# Patient Record
Sex: Female | Born: 1945
Health system: Southern US, Community
[De-identification: ages and names within clinical notes are randomized; demographics above are authoritative.]

## PROBLEM LIST (undated history)

## (undated) DIAGNOSIS — G4762 Sleep related leg cramps: Secondary | ICD-10-CM

## (undated) DIAGNOSIS — H269 Unspecified cataract: Secondary | ICD-10-CM

## (undated) DIAGNOSIS — G2581 Restless legs syndrome: Secondary | ICD-10-CM

## (undated) DIAGNOSIS — I10 Essential (primary) hypertension: Secondary | ICD-10-CM

## (undated) DIAGNOSIS — E079 Disorder of thyroid, unspecified: Secondary | ICD-10-CM

## (undated) DIAGNOSIS — M8580 Other specified disorders of bone density and structure, unspecified site: Secondary | ICD-10-CM

## (undated) DIAGNOSIS — G252 Other specified forms of tremor: Principal | ICD-10-CM

## (undated) DIAGNOSIS — G250 Essential tremor: Secondary | ICD-10-CM

## (undated) DIAGNOSIS — M1990 Unspecified osteoarthritis, unspecified site: Secondary | ICD-10-CM

## (undated) DIAGNOSIS — E785 Hyperlipidemia, unspecified: Secondary | ICD-10-CM

## (undated) DIAGNOSIS — G259 Extrapyramidal and movement disorder, unspecified: Secondary | ICD-10-CM

## (undated) HISTORY — PX: PLANTAR FASCIECTOMY: SUR600

## (undated) HISTORY — PX: SHOULDER SURGERY: SHX246

## (undated) HISTORY — DX: Sleep related leg cramps: G47.62

## (undated) HISTORY — DX: Essential tremor: G25.0

## (undated) HISTORY — DX: Other specified forms of tremor: G25.2

## (undated) HISTORY — DX: Essential (primary) hypertension: I10

## (undated) HISTORY — PX: PARTIAL HYSTERECTOMY: SHX80

## (undated) HISTORY — DX: Unspecified cataract: H26.9

## (undated) HISTORY — PX: BACK SURGERY: SHX140

## (undated) HISTORY — PX: CHOLECYSTECTOMY: SHX55

## (undated) HISTORY — DX: Hyperlipidemia, unspecified: E78.5

## (undated) HISTORY — PX: REPLACEMENT TOTAL KNEE: SUR1224

## (undated) HISTORY — DX: Extrapyramidal and movement disorder, unspecified: G25.9

## (undated) HISTORY — PX: NASAL SINUS SURGERY: SHX719

## (undated) HISTORY — DX: Other specified disorders of bone density and structure, unspecified site: M85.80

## (undated) HISTORY — DX: Restless legs syndrome: G25.81

## (undated) HISTORY — DX: Unspecified osteoarthritis, unspecified site: M19.90

## (undated) HISTORY — PX: TUBAL LIGATION: SHX77

## (undated) HISTORY — PX: CATARACT EXTRACTION: SUR2

## (undated) HISTORY — PX: COLONOSCOPY: SHX174

## (undated) HISTORY — PX: TONSILLECTOMY: SUR1361

## (undated) HISTORY — DX: Disorder of thyroid, unspecified: E07.9

---

## 1999-10-30 ENCOUNTER — Encounter: Payer: Self-pay | Admitting: *Deleted

## 1999-10-30 ENCOUNTER — Encounter: Admission: RE | Admit: 1999-10-30 | Discharge: 1999-10-30 | Payer: Self-pay | Admitting: *Deleted

## 2000-07-14 ENCOUNTER — Other Ambulatory Visit: Admission: RE | Admit: 2000-07-14 | Discharge: 2000-07-14 | Payer: Self-pay | Admitting: Gastroenterology

## 2000-07-14 ENCOUNTER — Encounter (INDEPENDENT_AMBULATORY_CARE_PROVIDER_SITE_OTHER): Payer: Self-pay | Admitting: Specialist

## 2000-11-01 ENCOUNTER — Encounter: Payer: Self-pay | Admitting: *Deleted

## 2000-11-01 ENCOUNTER — Encounter: Admission: RE | Admit: 2000-11-01 | Discharge: 2000-11-01 | Payer: Self-pay | Admitting: *Deleted

## 2001-04-17 ENCOUNTER — Other Ambulatory Visit: Admission: RE | Admit: 2001-04-17 | Discharge: 2001-04-17 | Payer: Self-pay | Admitting: *Deleted

## 2001-05-01 ENCOUNTER — Encounter: Admission: RE | Admit: 2001-05-01 | Discharge: 2001-05-01 | Payer: Self-pay | Admitting: *Deleted

## 2001-05-01 ENCOUNTER — Encounter: Payer: Self-pay | Admitting: *Deleted

## 2001-06-30 ENCOUNTER — Encounter: Payer: Self-pay | Admitting: Surgery

## 2001-07-07 ENCOUNTER — Observation Stay (HOSPITAL_COMMUNITY): Admission: RE | Admit: 2001-07-07 | Discharge: 2001-07-08 | Payer: Self-pay | Admitting: Surgery

## 2001-07-07 ENCOUNTER — Encounter (INDEPENDENT_AMBULATORY_CARE_PROVIDER_SITE_OTHER): Payer: Self-pay | Admitting: Specialist

## 2001-07-07 ENCOUNTER — Encounter: Payer: Self-pay | Admitting: Surgery

## 2001-11-02 ENCOUNTER — Encounter: Admission: RE | Admit: 2001-11-02 | Discharge: 2001-11-02 | Payer: Self-pay | Admitting: *Deleted

## 2001-11-02 ENCOUNTER — Encounter: Payer: Self-pay | Admitting: *Deleted

## 2002-05-13 ENCOUNTER — Encounter: Payer: Self-pay | Admitting: Emergency Medicine

## 2002-05-13 ENCOUNTER — Emergency Department (HOSPITAL_COMMUNITY): Admission: EM | Admit: 2002-05-13 | Discharge: 2002-05-13 | Payer: Self-pay | Admitting: Emergency Medicine

## 2002-11-07 ENCOUNTER — Encounter: Admission: RE | Admit: 2002-11-07 | Discharge: 2002-11-07 | Payer: Self-pay

## 2003-08-12 ENCOUNTER — Encounter: Admission: RE | Admit: 2003-08-12 | Discharge: 2003-08-12 | Payer: Self-pay | Admitting: Family Medicine

## 2003-11-12 ENCOUNTER — Encounter: Admission: RE | Admit: 2003-11-12 | Discharge: 2003-11-12 | Payer: Self-pay | Admitting: Family Medicine

## 2004-05-19 ENCOUNTER — Encounter: Admission: RE | Admit: 2004-05-19 | Discharge: 2004-05-19 | Payer: Self-pay | Admitting: Family Medicine

## 2004-05-28 ENCOUNTER — Encounter: Admission: RE | Admit: 2004-05-28 | Discharge: 2004-05-28 | Payer: Self-pay | Admitting: Family Medicine

## 2004-06-13 ENCOUNTER — Encounter: Admission: RE | Admit: 2004-06-13 | Discharge: 2004-06-13 | Payer: Self-pay | Admitting: Orthopedic Surgery

## 2004-06-23 ENCOUNTER — Observation Stay (HOSPITAL_COMMUNITY): Admission: RE | Admit: 2004-06-23 | Discharge: 2004-06-24 | Payer: Self-pay | Admitting: Orthopedic Surgery

## 2004-11-12 ENCOUNTER — Encounter: Admission: RE | Admit: 2004-11-12 | Discharge: 2004-11-12 | Payer: Self-pay | Admitting: Family Medicine

## 2005-07-05 ENCOUNTER — Observation Stay (HOSPITAL_COMMUNITY): Admission: RE | Admit: 2005-07-05 | Discharge: 2005-07-06 | Payer: Self-pay | Admitting: Obstetrics and Gynecology

## 2005-07-05 ENCOUNTER — Encounter (INDEPENDENT_AMBULATORY_CARE_PROVIDER_SITE_OTHER): Payer: Self-pay | Admitting: Specialist

## 2005-11-17 ENCOUNTER — Encounter: Admission: RE | Admit: 2005-11-17 | Discharge: 2005-11-17 | Payer: Self-pay | Admitting: Family Medicine

## 2005-12-28 ENCOUNTER — Encounter: Admission: RE | Admit: 2005-12-28 | Discharge: 2005-12-28 | Payer: Self-pay | Admitting: Family Medicine

## 2006-01-06 ENCOUNTER — Encounter: Admission: RE | Admit: 2006-01-06 | Discharge: 2006-01-06 | Payer: Self-pay | Admitting: Family Medicine

## 2006-03-30 ENCOUNTER — Ambulatory Visit: Admission: RE | Admit: 2006-03-30 | Discharge: 2006-03-30 | Payer: Self-pay | Admitting: Unknown Physician Specialty

## 2006-03-30 ENCOUNTER — Encounter: Payer: Self-pay | Admitting: Cardiovascular Disease

## 2006-03-30 ENCOUNTER — Ambulatory Visit: Payer: Self-pay | Admitting: Cardiovascular Disease

## 2006-03-30 ENCOUNTER — Encounter: Payer: Self-pay | Admitting: Vascular Surgery

## 2006-11-21 ENCOUNTER — Encounter: Admission: RE | Admit: 2006-11-21 | Discharge: 2006-11-21 | Payer: Self-pay | Admitting: Family Medicine

## 2006-11-28 ENCOUNTER — Encounter: Admission: RE | Admit: 2006-11-28 | Discharge: 2006-11-28 | Payer: Self-pay | Admitting: Family Medicine

## 2007-11-22 ENCOUNTER — Encounter: Admission: RE | Admit: 2007-11-22 | Discharge: 2007-11-22 | Payer: Self-pay | Admitting: Family Medicine

## 2008-03-07 ENCOUNTER — Encounter: Admission: RE | Admit: 2008-03-07 | Discharge: 2008-03-07 | Payer: Self-pay | Admitting: Orthopedic Surgery

## 2008-03-15 ENCOUNTER — Encounter: Admission: RE | Admit: 2008-03-15 | Discharge: 2008-03-15 | Payer: Self-pay | Admitting: Orthopedic Surgery

## 2008-07-03 ENCOUNTER — Other Ambulatory Visit: Admission: RE | Admit: 2008-07-03 | Discharge: 2008-07-03 | Payer: Self-pay | Admitting: Family Medicine

## 2008-08-06 ENCOUNTER — Inpatient Hospital Stay (HOSPITAL_COMMUNITY): Admission: RE | Admit: 2008-08-06 | Discharge: 2008-08-08 | Payer: Self-pay | Admitting: Orthopedic Surgery

## 2008-11-22 ENCOUNTER — Encounter: Admission: RE | Admit: 2008-11-22 | Discharge: 2008-11-22 | Payer: Self-pay | Admitting: Family Medicine

## 2009-07-15 ENCOUNTER — Other Ambulatory Visit: Admission: RE | Admit: 2009-07-15 | Discharge: 2009-07-15 | Payer: Self-pay | Admitting: Family Medicine

## 2009-07-23 ENCOUNTER — Encounter: Admission: RE | Admit: 2009-07-23 | Discharge: 2009-07-23 | Payer: Self-pay | Admitting: Family Medicine

## 2009-07-28 ENCOUNTER — Encounter (INDEPENDENT_AMBULATORY_CARE_PROVIDER_SITE_OTHER): Payer: Self-pay | Admitting: *Deleted

## 2009-08-20 ENCOUNTER — Encounter (INDEPENDENT_AMBULATORY_CARE_PROVIDER_SITE_OTHER): Payer: Self-pay | Admitting: *Deleted

## 2009-08-21 ENCOUNTER — Ambulatory Visit: Payer: Self-pay | Admitting: Gastroenterology

## 2009-09-04 ENCOUNTER — Ambulatory Visit: Payer: Self-pay | Admitting: Gastroenterology

## 2009-09-12 ENCOUNTER — Encounter: Payer: Self-pay | Admitting: Gastroenterology

## 2009-11-24 ENCOUNTER — Encounter: Admission: RE | Admit: 2009-11-24 | Discharge: 2009-11-24 | Payer: Self-pay | Admitting: Family Medicine

## 2010-03-10 IMAGING — MG MM SCREEN MAMMOGRAM BILATERAL
4 series · 4 of 4 positions shown · non-contrast
Comparison: none

DG SCREEN MAMMOGRAM BILATERAL
Bilateral CC and MLO view(s) were taken.

DIGITAL SCREENING MAMMOGRAM WITH CAD:
There are scattered fibroglandular densities.  No masses or malignant type calcifications are 
identified.  Compared with prior studies.
Images were processed with CAD.

[R CC]
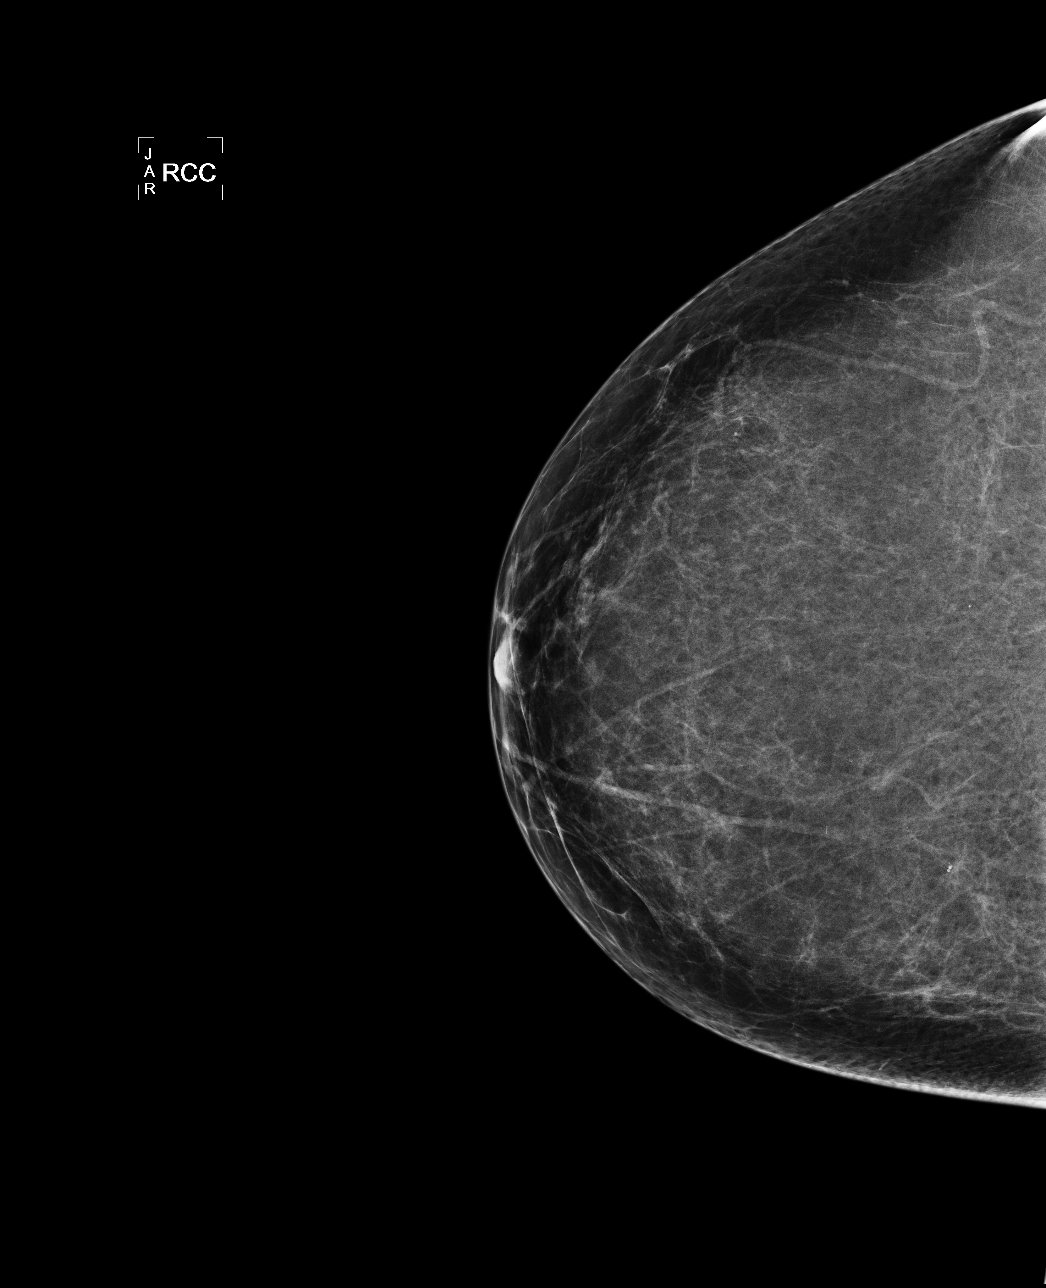

[L CC]
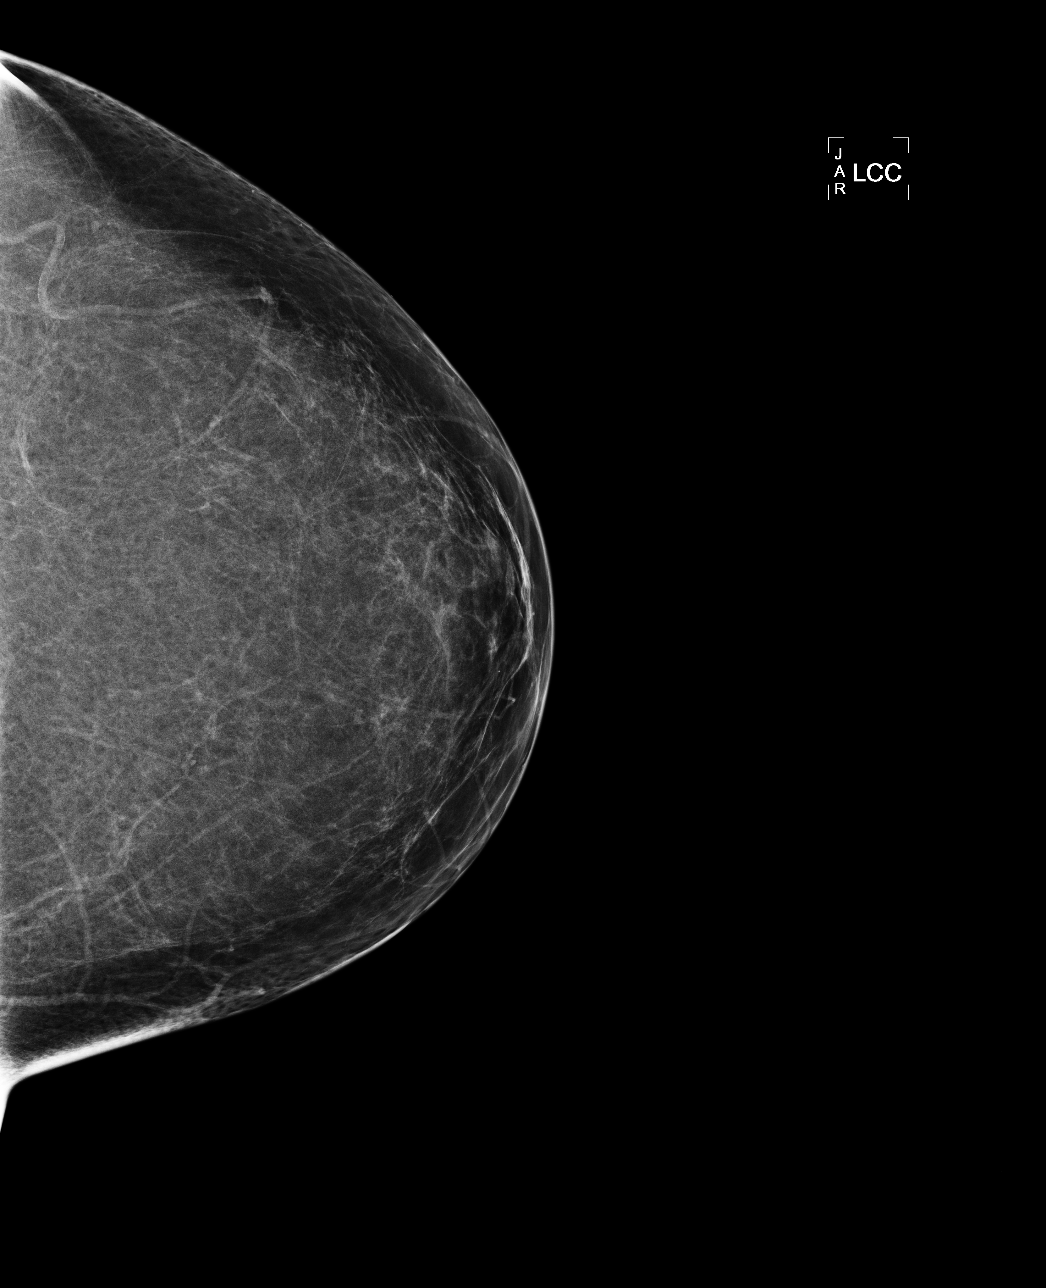

[L MLO]
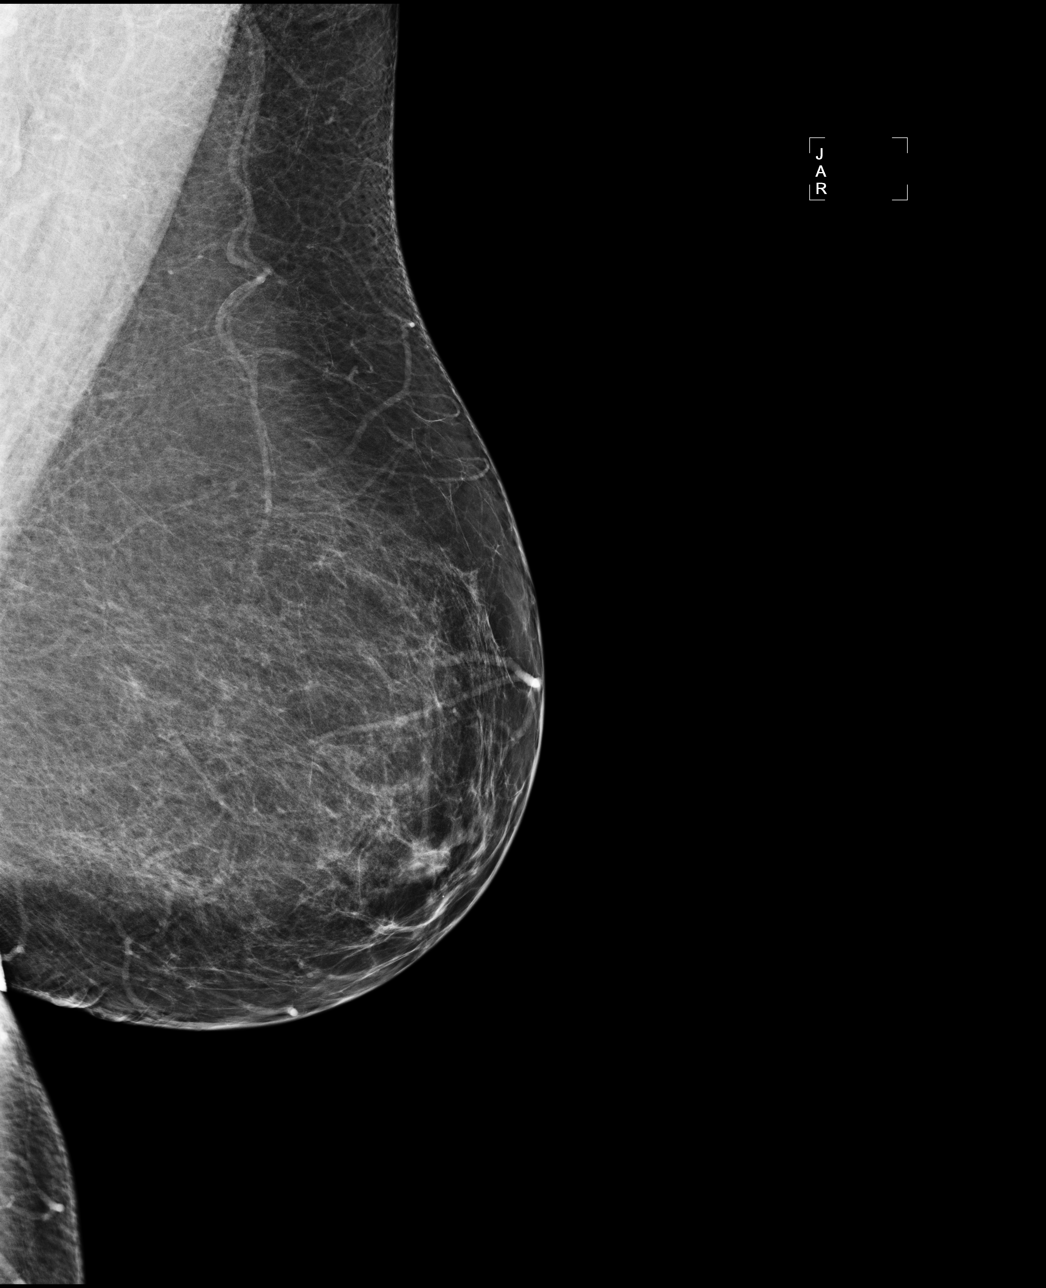

[R MLO]
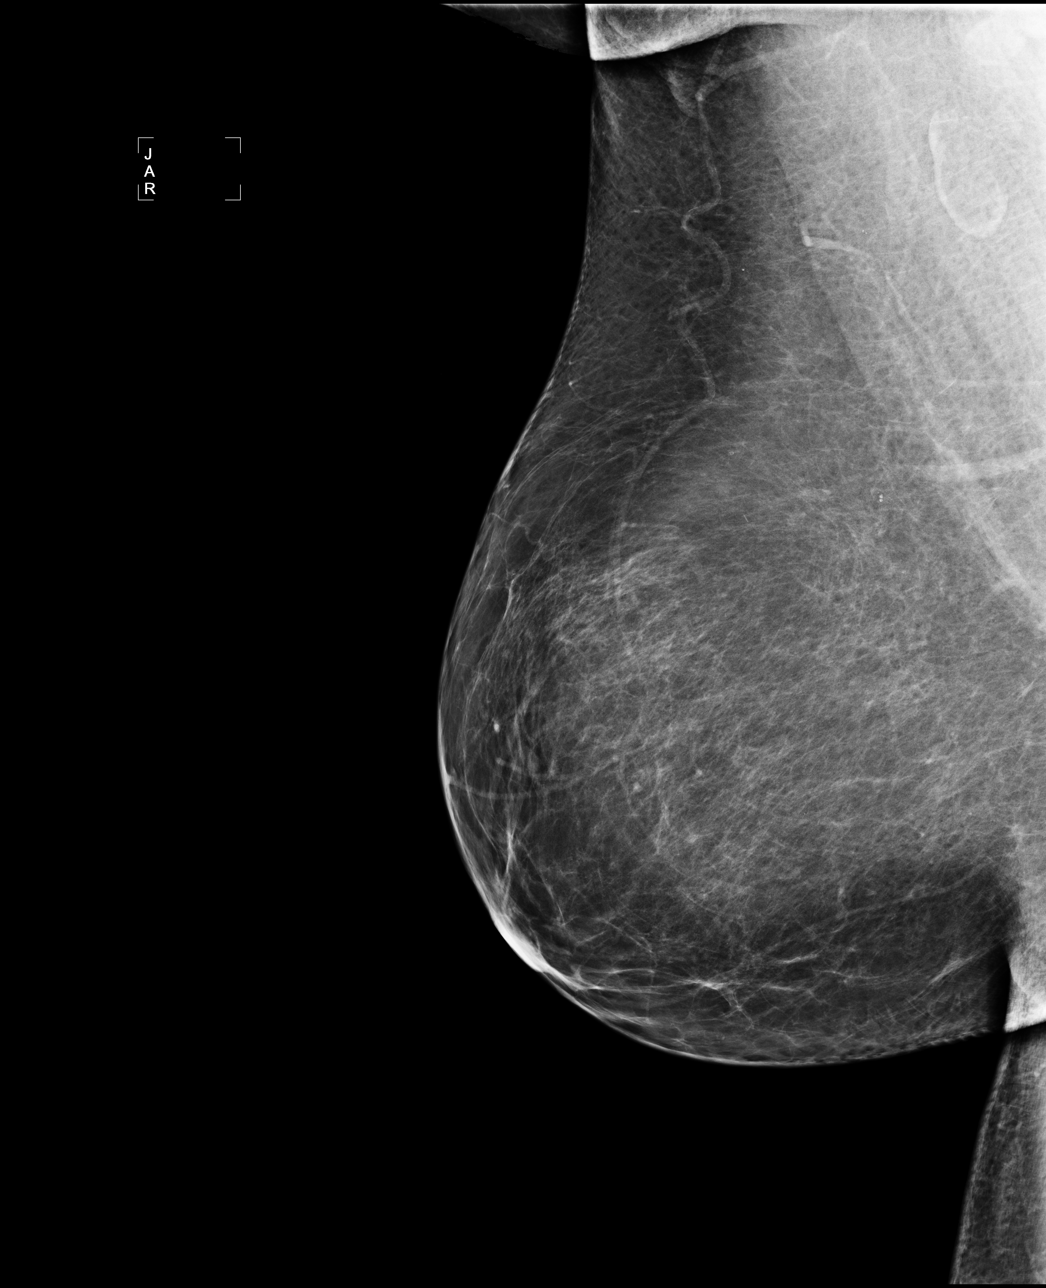

[4 of 4 positions shown; findings below may reference images not displayed]

IMPRESSION: No specific mammographic evidence of malignancy.  Next screening mammogram is recommended in one 
year.

A result letter of this screening mammogram will be mailed directly to the patient.

ASSESSMENT: Negative - BI-RADS 1

Screening mammogram in 1 year.
ANALYZED BY COMPUTER AIDED DETECTION. , THIS PROCEDURE WAS A DIGITAL MAMMOGRAM.

## 2010-06-30 NOTE — Procedures (Signed)
Summary: Colonoscopy  Patient: Brandi Richards Note: All result statuses are Final unless otherwise noted.  Tests: (1) Colonoscopy (COL)   COL Colonoscopy           DONE     [facility]     [address]     [place], [place]  [address]           COLONOSCOPY PROCEDURE REPORT           PATIENT:  Brandi, Richards  MR#:  [id number]     BIRTHDATE:  07-15-1945, 63 yrs. old  GENDER:  female           ENDOSCOPIST:  Barbette Hair. Arlyce Dice, MD     Referred by:           PROCEDURE DATE:  09/04/2009     PROCEDURE:  Colonoscopy with snare polypectomy, Colon with cold     biopsy polypectomy     ASA CLASS:  Class II     INDICATIONS:  1) history of pre-cancerous (adenomatous) colon     polyps  2) screening           MEDICATIONS:   Fentanyl 50 mcg IV, Versed 6 mg IV           DESCRIPTION OF PROCEDURE:   After the risks benefits and     alternatives of the procedure were thoroughly explained, informed     consent was obtained.  Digital rectal exam was performed and     revealed no abnormalities.   The LB CF-[id number] endoscope     was introduced through the anus and advanced to the cecum, which     was identified by both the appendix and ileocecal valve, without     limitations.  The quality of the prep was good, using MoviPrep.     The instrument was then slowly withdrawn as the colon was fully     examined.     <<PROCEDUREIMAGES>>           FINDINGS:  Two polyps were found in the cecum (see image3). 1mm     and 3mm sessile polyps - removed with cold bx snare and cold     polypectomy snare, respectively  There were multiple polyps     identified and removed (see image5, image6, image7, and image8). 4     diminutive 2-23mm polyps in proximal colon - removed with cold     polypectomy snare and submitted to pathology  A diminutive polyp     was found in the descending colon. It was 2 mm in size. Polyp was     snared without cautery. Retrieval was successful. snare polyp     Diverticula  were found in the descending colon (see image9). Few     diverticula  This was otherwise a normal examination of the colon     (see image1, image2, image4, image10, image12, image13, and     image14).   Retroflexed views in the rectum revealed no     abnormalities.    The time to cecum = 05:02 minutes. The scope was     then withdrawn (time = 16:18 min) from the patient and the     procedure completed.           COMPLICATIONS:  None           ENDOSCOPIC IMPRESSION:     1) Two polyps in the cecum     2) Polyps, multiple  3) 2 mm diminutive polyp in the descending colon     4) Diverticula in the descending colon     5) Otherwise normal examination     RECOMMENDATIONS:     1) Colonoscopy in 3 years due to multiplicity of polyps           REPEAT EXAM:  In 3 year(s) for Colonoscopy.           ______________________________     Barbette Hair. Arlyce Dice, MD           CC: Merri Brunette, MD           n.     Rosalie DoctorBarbette Hair. Kaplan at 09/04/2009 08:46 AM           Page 2 of 3   [place], [place], [id number]  Note: An exclamation mark (!) indicates a result that was not dispersed into the flowsheet. Document Creation Date: 09/04/2009 8:47 AM _______________________________________________________________________  (1) Order result status: Final Collection or observation date-time: 09/04/2009 08:36 Requested date-time:  Receipt date-time:  Reported date-time:  Referring Physician:   Ordering Physician: Melvia Heaps [phone]) Specimen Source:  Source: Launa Grill Order Number: [id number] Lab site:   Appended Document: Colonoscopy     Procedures Next Due Date:    Colonoscopy: 08/2012

## 2010-06-30 NOTE — Letter (Signed)
Summary: Previsit letter  [facility]  [address] [place], [place] [address]   Phone: [phone]  Fax: [phone]       07/28/2009 MRN: [id number]  [facility] [address] [place], [place]  [address]  Dear Ms. Rua,  Welcome to the Gastroenterology Division at [facility].    You are scheduled to see a nurse for your pre-procedure visit on 08-21-09 at 8:00a.m. on the 3rd floor at [facility], [address]  We ask that you try to arrive at our office 15 minutes prior to your appointment time to allow for check-in.  Your nurse visit will consist of discussing your medical and surgical history, your immediate family medical history, and your medications.    Please bring a complete list of all your medications or, if you prefer, bring the medication bottles and we will list them.  We will need to be aware of both prescribed and over the counter drugs.  We will need to know exact dosage information as well.  If you are on blood thinners (Coumadin, Plavix, Aggrenox, Ticlid, etc.) please call our office today/prior to your appointment, as we need to consult with your physician about holding your medication.   Please be prepared to read and sign documents such as consent forms, a financial agreement, and acknowledgement forms.  If necessary, and with your consent, a friend or relative is welcome to sit-in on the nurse visit with you.  Please bring your insurance card so that we may make a copy of it.  If your insurance requires a referral to see a specialist, please bring your referral form from your primary care physician.  No co-pay is required for this nurse visit.     If you cannot keep your appointment, please call [phone] to cancel or reschedule prior to your appointment date.  This allows [place] the opportunity to schedule an appointment for another patient in need of care.    Thank you for choosing [facility] for your medical  needs.  We appreciate the opportunity to care for you.  Please visit [place] at our website  to learn more about our practice.                     Sincerely.                                                                                                                   The Gastroenterology Division

## 2010-06-30 NOTE — Letter (Signed)
Summary: [facility] Instructions  Brandi Richards  [address] [place], [place] [address]   Phone: [phone]  Fax: [phone]       MAIE KESINGER    04/10/1946    MRN: [id number]        Procedure Day Dorna Bloom:  Lenor Coffin  09/04/09     Arrival Time:  7:30AM      Procedure Time:  8:00AM     Location of Procedure:                    Juliann Pares  [facility] (4th Floor)                        PREPARATION FOR COLONOSCOPY WITH MOVIPREP   Starting 5 days prior to your procedure 08/30/09 do not eat nuts, seeds, popcorn, corn, beans, peas,  salads, or any raw vegetables.  Do not take any fiber supplements (e.g. Metamucil, Citrucel, and Benefiber).  THE DAY BEFORE YOUR PROCEDURE         DATE: 09/03/09  DAY: WEDNESDAY  1.  Drink clear liquids the entire day-NO SOLID FOOD  2.  Do not drink anything colored red or purple.  Avoid juices with pulp.  No orange juice.  3.  Drink at least 64 oz. (8 glasses) of fluid/clear liquids during the day to prevent dehydration and help the prep work efficiently.  CLEAR LIQUIDS INCLUDE: Water Jello Ice Popsicles Tea (sugar ok, no milk/cream) Powdered fruit flavored drinks Coffee (sugar ok, no milk/cream) Gatorade Juice: apple, white grape, white cranberry  Lemonade Clear bullion, consomm, broth Carbonated beverages (any kind) Strained chicken noodle soup Hard Candy                             4.  In the morning, mix first dose of MoviPrep solution:    Empty 1 Pouch A and 1 Pouch B into the disposable container    Add lukewarm drinking water to the top line of the container. Mix to dissolve    Refrigerate (mixed solution should be used within 24 hrs)  5.  Begin drinking the prep at 5:00 p.m. The MoviPrep container is divided by 4 marks.   Every 15 minutes drink the solution down to the next mark (approximately 8 oz) until the full liter is complete.   6.  Follow completed prep with 16 oz of clear liquid of your choice  (Nothing red or purple).  Continue to drink clear liquids until bedtime.  7.  Before going to bed, mix second dose of MoviPrep solution:    Empty 1 Pouch A and 1 Pouch B into the disposable container    Add lukewarm drinking water to the top line of the container. Mix to dissolve    Refrigerate  THE DAY OF YOUR PROCEDURE      DATE: 09/04/09  DAY: THURSDAY  Beginning at 3:00AM (5 hours before procedure):         1. Every 15 minutes, drink the solution down to the next mark (approx 8 oz) until the full liter is complete.  2. Follow completed prep with 16 oz. of clear liquid of your choice.    3. You may drink clear liquids until 6:00AM (2 HOURS BEFORE PROCEDURE).   MEDICATION INSTRUCTIONS  Unless otherwise instructed, you should take regular prescription medications with a small sip of water   as early as possible the  morning of your procedure.  Hold HCTZ the day of your procedure only before coming in for procedure       OTHER INSTRUCTIONS  You will need a responsible adult at least 66 years of age to accompany you and drive you home.   This person must remain in the waiting room during your procedure.  Wear loose fitting clothing that is easily removed.  Leave jewelry and other valuables at home.  However, you may wish to bring a book to read or  an iPod/MP3 player to listen to music as you wait for your procedure to start.  Remove all body piercing jewelry and leave at home.  Total time from sign-in until discharge is approximately 2-3 hours.  You should go home directly after your procedure and rest.  You can resume normal activities the  day after your procedure.  The day of your procedure you should not:   Drive   Make legal decisions   Operate machinery   Drink alcohol   Return to work  You will receive specific instructions about eating, activities and medications before you leave.    The above instructions have been reviewed and explained to me by    Karl Bales RN  August 21, 2009 8:16 AM     I fully understand and can verbalize these instructions _____________________________ Date _________

## 2010-06-30 NOTE — Miscellaneous (Signed)
Summary: [facility] previsit  Clinical Lists Changes  Medications: Added new medication of MOVIPREP 100 GM  SOLR (PEG-KCL-NACL-NASULF-NA ASC-C) As per prep instructions. - Signed Rx of MOVIPREP 100 GM  SOLR (PEG-KCL-NACL-NASULF-NA ASC-C) As per prep instructions.;  #1 x 0;  Signed;  Entered by: Karl Bales RN;  Authorized by: Louis Meckel MD;  Method used: Electronically to [facility] [address] [place], [place], [place]  [address], Ph: [phone], Fax: [phone] Allergies: Added new allergy or adverse reaction of SULFA Observations: Added new observation of ALLERGY REV: Done (08/21/2009 7:52) Added new observation of NKA: F (08/21/2009 7:52)    Prescriptions: MOVIPREP 100 GM  SOLR (PEG-KCL-NACL-NASULF-NA ASC-C) As per prep instructions.  #1 x 0   Entered by:   Karl Bales RN   Authorized by:   Louis Meckel MD   Signed by:   Karl Bales RN on 08/21/2009   Method used:   Electronically to        [facility] (retail)       [address]       [place], [place]  [address]       Ph: [phone]       Fax: [phone]   RxID:   [id number]

## 2010-06-30 NOTE — Letter (Signed)
Summary: Patient Notice- Polyp Results  [facility]  [address] [place], [place] [address]   Phone: [phone]  Fax: [phone]        September 12, 2009 MRN: [id number]    [facility] [address] [place], [place]  [address]    Dear Ms. Cotterman,  I am pleased to inform you that the colon polyp(s) removed during your recent colonoscopy was (were) found to be benign (no cancer detected) upon pathologic examination.  I recommend you have a repeat colonoscopy examination in 3_ years to look for recurrent polyps, as having colon polyps increases your risk for having recurrent polyps or even colon cancer in the future.  Should you develop new or worsening symptoms of abdominal pain, bowel habit changes or bleeding from the rectum or bowels, please schedule an evaluation with either your primary care physician or with me.  Additional information/recommendations:  __ No further action with gastroenterology is needed at this time. Please      follow-up with your primary care physician for your other healthcare      needs.  __ Please call [phone] to schedule a return visit to review your      situation.  __ Please keep your follow-up visit as already scheduled.  _x_ Continue treatment plan as outlined the day of your exam.  Please call us if you are having persistent problems or have questions about your condition that have not been fully answered at this time.  Sincerely,  Louis Meckel MD  This letter has been electronically signed by your physician.  Appended Document: Patient Notice- Polyp Results letter mailed 4.20.11.

## 2010-09-10 LAB — URINALYSIS, ROUTINE W REFLEX MICROSCOPIC
Bilirubin Urine: NEGATIVE
Hgb urine dipstick: NEGATIVE
Nitrite: NEGATIVE
Protein, ur: NEGATIVE mg/dL
Urobilinogen, UA: 0.2 mg/dL (ref 0.0–1.0)

## 2010-09-10 LAB — COMPREHENSIVE METABOLIC PANEL
ALT: 53 U/L — ABNORMAL HIGH (ref 0–35)
AST: 41 U/L — ABNORMAL HIGH (ref 0–37)
Albumin: 4.4 g/dL (ref 3.5–5.2)
Calcium: 10.1 mg/dL (ref 8.4–10.5)
Creatinine, Ser: 0.89 mg/dL (ref 0.4–1.2)
GFR calc Af Amer: >60 mL/min (ref >60)
GFR calc non Af Amer: >60 mL/min (ref >60)
Sodium: 144 mEq/L (ref 135–145)
Total Protein: 7 g/dL (ref 6.0–8.3)

## 2010-09-10 LAB — CBC
MCHC: 34.4 g/dL (ref 30.0–36.0)
MCV: 98.6 fL (ref 78.0–100.0)
Platelets: 185 10*3/uL (ref 150–400)
RDW: 12.1 % (ref 11.5–15.5)

## 2010-09-10 LAB — URINE CULTURE: Colony Count: 25000

## 2010-09-10 LAB — PROTIME-INR: Prothrombin Time: 13.3 seconds (ref 11.6–15.2)

## 2010-09-10 LAB — DIFFERENTIAL
Eosinophils Relative: 3 % (ref 0–5)
Lymphocytes Relative: 42 % (ref 12–46)
Lymphs Abs: 1.9 10*3/uL (ref 0.7–4.0)
Monocytes Absolute: 0.4 10*3/uL (ref 0.1–1.0)
Monocytes Relative: 9 % (ref 3–12)

## 2010-09-10 LAB — HEMOGLOBIN AND HEMATOCRIT, BLOOD
HCT: 41.3 % (ref 36.0–46.0)
Hemoglobin: 12.7 g/dL (ref 12.0–15.0)
Hemoglobin: 13.2 g/dL (ref 12.0–15.0)
Hemoglobin: 14.1 g/dL (ref 12.0–15.0)

## 2010-10-13 NOTE — H&P (Signed)
[name], Richards               ACCOUNT NO.:  [id number]   MEDICAL RECORD NO.:  [id number]          PATIENT TYPE:  INP   LOCATION:                               FACILITY:  [facility]   PHYSICIAN:  Georges Lynch. Gioffre, M.D.DATE OF BIRTH:  Aug 01, 1945   DATE OF ADMISSION:  08/06/2008  DATE OF DISCHARGE:                              HISTORY & PHYSICAL   CHIEF COMPLAINT:  Bilateral lower legs and back pain.   HISTORY OF PRESENT ILLNESS:  Brandi Richards is a 65 year old female patient  of Dr. Jeannetta Ellis who has had an evaluation for lower back and bilateral  leg pain.  Her evaluation found on a CT myelogram she had moderate to  mild stenosis at L4-5, but she also has a small disk at L5-S1, putting  pressure on the L5 nerve root.  The patient has failed conservative  treatment.  This  has been going on for an excess of 3 years.  She does  have some weakness at the extensor mechanism of the toes on the left.  The patient has elected to proceed with a complete decompressive lumbar  laminectomy at L4-5 with an evaluation of L5-S1 for possible small disk  L5-S1 left.   ALLERGIES:  SULFA, INTOLERANT TO PERCOCET CAUSING SEVERE NAUSEA AND  VOMITING AND SLIGHT NAUSEA WITH DILAUDID.  HAS USED MEPERGAN FORTIS IN  THE PAST WITH GOOD RESULTS WITHOUT ANY PROBLEMS.   CURRENT MEDICATIONS:  1. Lisinopril 10 mg a day.  2. Hydrochlorothiazide 12.5 mg a day.  3. Crestor 10 mg a day.  4. Synthroid 112 mcg a day.  5. Zantac 150 mg once a day.  6. Dilaudid 2 mg p.o. q.h.s. p.r.n. pain.  7. Multivitamins once a day.  8. Vitamin B1 once at noon.  9. Vitamin D once today.  10.Fish oil 2000 mg twice a day.  11.Calcium 600 twice a day.  12.Folic acid 800 mcg once a day.  13.Vitamin B12 1000 mcg once a day.   PAST MEDICAL HISTORY:  1. Hypertension.  2. Hiatal hernia.  3. Asymptomatic diverticulosis.  4. Thyroid disease.  5. Recent workup with some reported leukopenia with treatment with      folic acid and B12.   Recent rechecked with no results yet.  History      of a stress test 5-10 years previous was normal.   REVIEW OF SYSTEMS:  NEUROLOGIC:  Unremarkable.  PULMONARY:  Unremarkable.  CARDIOVASCULAR:  She used to have some tachycardia, but  that resolved once being placed on thyroid medicine.  Stress test was 5-  10 years previous for routine checkup, was reported normal.  Hypertension medications have been stable.  She has some heartburn and  indigestion related to her hiatal hernia and is well controlled with her  Zantac.  She has had her gallbladder removed from stones in the past.  She has asymptomatic diverticulosis.  GU:  Unremarkable.  ENDOCRINE:  Unremarkable except for well stable controlled thyroid.  HEMATOLOGIC.  She reports her white blood count was slightly low.  She was placed on  folic acid and vitamin B12 about  3-4 weeks previous.  She has recently  had it rechecked and does not know the results.  She denies any fevers,  chills or any other illnesses at this present time.   PAST SURGICAL HISTORY:  1. Tonsillectomy.  2. Left shoulder.  3. Tubes tied.  4. Hysterectomy.  5. Prolapsed bladder.  6. Sinus surgery.  7. Cholecystectomy without any anesthetic complications.   With her left shoulder.  She did have some problems with severe nausea  and vomiting with her Percocet and was placed on Mepergan fortis without  any issues.   PAST FAMILY/MEDICAL HISTORY:  Father and mother are deceased, unknown  medical issue was not raised by her parents.  She has one sister who had  similar medical issues.   SOCIAL HISTORY:  The patient is married, she is retired, lives with her  husband.  She has three grown children.  She denies any smoking.  Her  husband will provide care postop.   PHYSICAL EXAMINATION:  VITAL SIGNS:  Height is 5 feet 5 inches, weight  is 185 pounds, blood pressure is 138/88, pulse of 78 and regular,  respirations 12, patient is afebrile.  The patient is a little  anxious.  GENERAL:  This is healthy-appearing, well-developed female who appears  to be slightly anxious, appears to be in no extreme distress.  HEENT:  Head was normocephalic.  Pupils equal, round and reactive.  Gross hearing is intact.  NECK:  Supple.  No palpable lymphadenopathy.  Thyroid region was  nontender.  Good range of motion.  CHEST:  Lung sounds were clear and equal bilaterally.  No wheezes,  rales, rhonchi.  HEART:  Regular rate and rhythm.  No murmurs, rubs or gallops.  ABDOMEN:  Soft, nontender.  Bowel sounds present.  UPPER EXTREMITIES:  Had good range of motion of shoulders, elbows and  wrists.  LOWER EXTREMITIES:  Showed good motion of both hips, knees and ankles  today.  PERIPHERAL VASCULAR:  Carotid pulses were 2+ no bruits.  Radial pulses  were 2+.  Posterior tibial pulses were 1+.  She had no lower extremity  edema or pigmentation changes.  NEUROLOGICAL:  The patient was conscious, alert and appropriate.  She  did have lower back and bilateral leg pain.  She does report slight  decrease of light touch sensation over lateral calves.  She has gotten a  little bit of left weak great toe extension, but she has good extension  at both ankles, good knee extension.  BREAST/RECTAL/GU:  Exams were deferred at this time.   IMPRESSION:  1. Severe lumbar and bilateral leg pain with spinal stenosis at L4-5      with possible small herniated disk at L5-S1 on the left.  2. Hypertension.  3. Thyroid disease.  4. Asymptomatic diverticulosis.  5. Possible leukopenia currently being evaluated.  6. Hiatal hernia.  7. Normal stress test 5-10 years previous.   PLAN:  The patient will undergo all routine labs and tests prior to  having a central decompressive lumbar laminectomy at L4-5 with an  evaluation L5-S1 for possible microdiskectomy.  The patient will undergo  all routine labs and test.      Jamelle Rushing, P.A.    ______________________________  Georges Lynch.  Darrelyn Hillock, M.D.    RWK/MEDQ  D:  07/30/2008  T:  07/30/2008  Job:  [id number]   cc:   Windy Fast A. Darrelyn Hillock, M.D.  Fax: [phone]

## 2010-10-13 NOTE — Op Note (Signed)
NAMEHANNIA, Brandi Richards               ACCOUNT NO.:  [id number]   MEDICAL RECORD NO.:  [id number]          PATIENT TYPE:  INP   LOCATION:  0003                         [facility]   PHYSICIAN:  Georges Lynch. Gioffre, M.D.DATE OF BIRTH:  11/25/1945   DATE OF PROCEDURE:  08/06/2008  DATE OF DISCHARGE:                               OPERATIVE REPORT   SURGEON:  Georges Lynch. Darrelyn Hillock, M.D.   ASSISTANT:  Marlowe Kays, M.D.   PREOPERATIVE DIAGNOSES:  1. Moderate spinal stenosis, 3-4 inferior to the 3-4 space.  2. Severe spinal stenosis at L4-5 with foraminal stenosis bilaterally.   POSTOPERATIVE DIAGNOSES:  1. Moderate spinal stenosis, 3-4 inferior to the 3-4 space.  2. Severe spinal stenosis at L4-5 with foraminal stenosis bilaterally.   OPERATION:  1. Complete decompressive lumbar laminectomy at L4-L5.  2. Hemilaminectomy bilaterally at L3-4 region proximally for stenosis.  3. Foraminotomies bilaterally at both levels.   PROCEDURE:  Under general anesthesia, routine orthopedic prep and drape  of the lower back was carried out.  The patient had 1 gram of IV Ancef.  She was on a spinal frame.  Two needles were placed in the back for  localization purposes.  X-ray was taken.  Once I identified the L4-5  space an incision was made over L3-4, L4-5 bilaterally.  I then  separated the muscle from the lamina and spinous process bilaterally.  The self-retaining retractors were inserted.  An x-ray was taken to  verify the position.  Following that I did a complete decompressive  lumbar laminectomy.  I started centrally at 4-5, removed the spinous  process of 4 and the lamina of 4, went down and decompressed the dura  distally, went out and did foraminotomies bilaterally.  We then advanced  proximally and prior to doing that I did insert an instrument and took  another x-ray to make sure we had the right space.  Following that we  then decompressed proximally a portion of L3-4 region until she  was wide  open.  Note, there was no stenosis proximally and now distally or  laterally after the decompression.  We did check with the hockey-stick  to make sure we had the decompression complete.  We did foraminotomies  bilaterally as well.  Note, all her pain  was mainly in the left leg and  very minimal on the right preop.  I thoroughly irrigated out the area,  loosely applied some thrombin-soaked Gelfoam and closed with the wound  in layers in the usual fashion, except I left a small distal deep part  of the wound open for drainage purposes.  Sterile Neosporin dressing was  applied.  The patient left the operating room in satisfactory condition.           ______________________________  Georges Lynch Darrelyn Hillock, M.D.    RAG/MEDQ  D:  08/06/2008  T:  08/06/2008  Job:  [id number]

## 2010-10-16 NOTE — H&P (Signed)
[name], Brandi Richards               ACCOUNT NO.:  [id number]   MEDICAL RECORD NO.:  [id number]          PATIENT TYPE:   LOCATION:                                 FACILITY:   PHYSICIAN:  Randye Lobo, M.D.   DATE OF BIRTH:  [birth year]   DATE OF ADMISSION:  07/05/2005  DATE OF DISCHARGE:                                HISTORY & PHYSICAL   CHIEF COMPLAINT:  Pelvic organ prolapse.   HISTORY OF PRESENT ILLNESS:  The patient is a 65 year old gravida 3, para 92,  Caucasian female referred by Dr. Kyra Manges for evaluation and treatment  of vaginal prolapse. The patient is status post total vaginal hysterectomy  with anterior colporrhaphy secondary to vaginal prolapse in 1981 by Dr.  Elana Alm.  The patient The patient more recently had problems with recurrent  urinary tract infection, and she has been evaluated by Dr. Vic Blackbird  who performed a cystoscopy which was normal.  At that time, the patient was  noted to have an enterocele, and she was sent to Dr. Marcine Matar who  treated the patient with a pessary and vaginal estrogen cream.  The patient  was subsequently referred back to Dr. Elana Alm for further care.   The patient denies any significant problems with urinary leakage.  She does  not leak with coughing or sneezing.  She has rare leakage if she laughs  extremely hard.  The patient has some urinary incontinence which occurs if  she lets her bladder get too full.  She does have constipation which she  treats daily with a laxative at bedtime.  She denies a history of any fecal  incontinence.   The patient declines any further conservative therapy for her prolapse, and  she wishes to proceed with surgical treatment.   PAST OBSTETRIC AND GYNECOLOGIC HISTORY:  1.  Status post vaginal delivery x2.  One of the patient's children is      deceased.  She has one adopted child.  2.  Status post bilateral tubal ligation.  3.  Status post total vaginal hysterectomy with  anterior colporrhaphy in      1981.  4.  The patient is not taking any oral hormone therapy.  5.  Last Pap smear in December 2006 was within normal limits.  6.  Last mammogram was performed in June 2006 and was within normal limits.   PAST MEDICAL HISTORY:  1.  Diverticulosis.  2.  History of recurrent urinary tract infection.  3.  Hypertension.  4.  Hyperlipidemia.  5.  Hypothyroidism.   PAST SURGICAL HISTORY:  1.  Status post tonsillectomy in 1955.  2.  Status post bilateral tubal ligation.  3.  Status post total abdominal hysterectomy with anterior colporrhaphy in      1981.  4.  Status post shoulder surgery.  5.  Status post sinus surgery in 1985.  6.  Status post foot surgery in 1999.  7.  Status post cholecystectomy in 2003.   MEDICATIONS:  Synthroid, Toprol, Vitorin, Zetia, clonidine, lisinopril.   ALLERGIES:  SULFA.   SOCIAL HISTORY:  The patient has been  married for 40 years.  She is a  Fut[employer] trader.  She denies the use of tobacco, alcohol, or illicit drugs.   FAMILY HISTORY:  The patient's sister has hypertension and hyperlipidemia.  Negative family history for colon, ovarian, uterine, or breast cancer.   REVIEW OF SYSTEMS:  Please refer to History of Present Illness.   PHYSICAL EXAMINATION:  VITAL SIGNS: Height 5 feet 6 inches, weight 206  pounds.  Blood pressure 122/70.  HEENT:  Normocephalic and atraumatic.  LUNGS:  Clear to auscultation bilaterally. No evidence of murmurs, rubs, or  gallops.  ABDOMEN: Soft and nontender without evidence of hepatosplenomegaly or  organomegaly.  PELVIC: Normal external genitalia and urethra.  There is good anterior  vaginal wall support.  There appears to be good apical vaginal support.  There is a third degree rectocele with a probable enterocele.  The cervix  and uterus are absent.  On bimanual examination, there are no midline nor  adnexal masses noted.   IMPRESSION:  The patient is a 65 year old para 68 female with evidence  of a  rectocele and probable enterocele.   PLAN:  The patient will undergo a posterior colporrhaphy with probable  enterocele repair and possible iliococcygeal vaginal vault suspension at  [facility] on July 05, 2005.  Risks, benefits, and alternatives  have been discussed with patient who wishes to proceed.      Randye Lobo, M.D.  Electronically Signed     BES/MEDQ  D:  07/04/2005  T:  07/04/2005  Job:  [id number]

## 2010-10-16 NOTE — Op Note (Signed)
[facility]  Patient:    Brandi Richards, Brandi Richards Visit Number: [id number] MRN: [id number]          Service Type: SUR Location: 3W 0377 01 Attending Physician:  Katha Cabal Dictated by:   Thornton Park Daphine Deutscher, M.D. Proc. Date: 07/07/01 Admit Date:  07/07/2001   CC:         Heather Roberts, M.D.   Operative Report  [id number]  PREOPERATIVE DIAGNOSIS:  Cholelithiasis.  POSTOPERATIVE DIAGNOSIS:  Chronic cholecystitis.  PROCEDURE:  Laparoscopic cholecystectomy with intraoperative cholangiogram.  SURGEON:  Thornton Park. Daphine Deutscher, M.D.  ASSISTANT:  Sharlet Salina T. Hoxworth, M.D.  ANESTHESIA:  General endotracheal.  INDICATION:  Ms. Mastrogiovanni is a 65 year old lady, who has had at least one severe protracted attack of abdominal pain in the right upper quadrant going into her back.  There well may have been others over the years.  An ultrasound showed gallstones.  Informed consent was obtained regarding laparoscopic as well as open cholecystectomy.  DESCRIPTION OF PROCEDURE:  She was taken to room 1 and given general anesthesia.   The abdomen was prepped with Betadine and draped sterilely.  I made a longitudinal incision down into the umbilicus and through a small incision in the fascia, then through a pursestring suture of 0 Vicryl, I inserted the Hasson cannula.  The abdomen was insufflated.  The abdomen was surveyed, and it was otherwise unremarkable except after I placed my upper abdominal trocars and attempted to visualize the gallbladder, I found that it had adhesions all around the fundus.  These are depicted in the chart, as it was pretty well socked-in.  I took those down with sharp dissection, then I grasped the gallbladder and dissected free the cystic duct.  I put a clip upon the gallbladder and incised the cystic duct and then took a dynamic cholangiogram which showed intrahepatic filling and free flow of contrast into the duodenum without  evidence of stones or strictures.  The cystic was triple clipped, divided, and the gallbladder was then removed using the hook electrocautery.  I did make a little puncture and got a little bile drainage about midway up, but this was controlled.  There was no stone spillage.  The gallbladder was then removed, placed in a bag, and brought out through the umbilicus without difficulty.  I went back and reinspected the gallbladder bed, and no bleeding or bile leaks were noted.  We did cauterize a little area at the edge, but that was minimal oozing.  Everything looked good, and we irrigated and then repaired the umbilical port with Vicryl under direct laparoscopic exposure.  All of the port sites were injected with Marcaine, and then they were closed with 4-0 Vicryl, Benzoin, and Steri-Strips.  The patient seemed to tolerate the procedure well.  She will be admitted for observation overnight. Dictated by:   Thornton Park Daphine Deutscher, M.D. Attending Physician:  Katha Cabal DD:  07/07/01 TD:  07/07/01 Job: [id number]

## 2010-10-16 NOTE — Op Note (Signed)
NAMEMAKINZEY, Brandi Richards               ACCOUNT NO.:  [id number]   MEDICAL RECORD NO.:  [id number]          PATIENT TYPE:  INP   LOCATION:  0005                         FACILITY:  [facility]   PHYSICIAN:  Randye Lobo, M.D.   DATE OF BIRTH:  May 20, 1946   DATE OF PROCEDURE:  07/05/2005  DATE OF DISCHARGE:                                 OPERATIVE REPORT   PREOPERATIVE DIAGNOSES:  1.  Third degree rectocele.  2.  Probable enterocele.   POSTOPERATIVE DIAGNOSIS:  1.  Third degree rectocele.  2.  Enterocele.   PROCEDURE:  Posterior colporrhaphy with enterocele repair.   SURGEON:  Conley Simmonds, MD   ANESTHESIA:  General endotracheal, local with 0.5% lidocaine with 1:200,000  of epinephrine.   IV FLUIDS:  2200 mL of Ringer's lactate.   ESTIMATED BLOOD LOSS:  100 mL.   URINE OUTPUT:  800 mL.   COMPLICATIONS:  None.   INDICATIONS FOR PROCEDURE:  The patient is a 65 year old gravida 3, para 62  Caucasian female, status post total vaginal hysterectomy with anterior  colporrhaphy, who was noted to have a large rectocele and enterocele on  examination.  The patient had been previously treated with pessaries and  vaginal estrogen cream which was not satisfactory to her.  The patient  denies any significant symptoms of stress incontinence.  The patient wishes  for surgical treatment of her prolapse, and a plan is made to proceed with a  posterior colporrhaphy and probable enterocele repair with possible vaginal  vault suspension after risks, benefits, and alternatives are discussed with  her.   FINDINGS:  Examination under anesthesia revealed excellent anterior vaginal  wall and vaginal apical support.  The cervix and uterus are surgically  absent.  There is a large third-degree high rectocele.  Upon opening the  posterior vaginal wall, the rectocele is appreciated with a small  enterocele.   Rectal examination during and at the termination of the repair document the  absence of sutures in  the rectum.   SPECIMENS:  The enterocele sac was sent to pathology.   PROCEDURE:  The patient was reidentified in the preoperative hold area.  She  did receive Mefoxin 1 g IV for antibiotic prophylaxis.  She received both  TED hose and PAS stockings for DVT prophylaxis.   In the operating room, general endotracheal anesthesia was induced, and the  patient was then placed in the dorsal lithotomy position.  The vagina and  perineum were then sterilely prepped and draped.   Allis clamps were used to mark the midline of the posterior vaginal wall.  The posterior vaginal mucosa and the perineal body was then injected with  0.5% lidocaine with 1:200,000 of epinephrine.  A triangular wedge of  epithelium was excised from the perineal body and then the posterior vaginal  was incised vertically in the midline using a Metzenbaum scissors.  The  fascia was dissected off of the overlying vaginal epithelium bilaterally.  At the top of the rectocele, there was an obvious enterocele sac which was  appreciated.  This was first dissected off of the overlying vaginal  epithelium.  The enterocele sac was entered sharply, and the edges of the  enterocele sac were held open with hemostat clamps.  A gloved hand was then  placed in the rectum, and the enterocele sac was dissected off of the  overlying vaginal mucosa superiorly and off of the rectum inferiorly.  A  single pursestring suture of 2-0 Ethibond was used to close the enterocele  sac, and the excess sac was excised and sent to pathology.  The posterior  colporrhaphy was then performed with a series of vertical mattress sutures  of both 2-0 Vicryl and 0 Vicryl which produced excellent reduction of the  rectocele.  At the very top of the enterocele repair, a small pursestring  suture of 2-0 Vicryl was used to completely reduce the enterocele sac.  This  suture was attached to the underlying vaginal mucosa in the midline at the  12 o'clock position.   This provided a continuous and complete reduction of  the rectocele.  Excess vaginal mucosa was then trimmed bilaterally.  The  posterior vaginal wall was closed with a running locked suture of 2-0  Vicryl.  A crown stitch of 0 Vicryl was placed at the perineum. The closure  of the perineum was performed in a subcuticular fashion.   An iodoform vaginal packing was placed at this time which contained Estrace  cream as well.  A Foley catheter was placed inside the bladder, and clear  yellow urine was drained from the bladder.  Final rectal exam confirmed the  absence of sutures in the rectum.   This concluded the patient's surgery.  There were no complications.  All  needle, instrument, and sponge counts were correct.      Randye Lobo, M.D.  Electronically Signed     BES/MEDQ  D:  07/05/2005  T:  07/05/2005  Job:  [id number]   cc:   S. Kyra Manges, M.D.  Fax: [phone]

## 2010-10-16 NOTE — Op Note (Signed)
[name], Brandi Richards               ACCOUNT NO.:  [id number]   MEDICAL RECORD NO.:  [id number]          PATIENT TYPE:  AMB   LOCATION:  DAY                          FACILITY:  [facility]   PHYSICIAN:  Georges Lynch. Gioffre, M.D.DATE OF BIRTH:  [birth year]   DATE OF PROCEDURE:  06/23/2004  DATE OF DISCHARGE:                                 OPERATIVE REPORT   SURGEON:  Georges Lynch. Darrelyn Hillock, M.D.   ASSISTANT:  Nurse.   PREOPERATIVE DIAGNOSIS:  Severe impingement syndrome with questionable tears  of rotator cuff tear, left shoulder.   POSTOPERATIVE DIAGNOSIS:  Severe impingement syndrome, left shoulder, with  abrasion of the rotator cuff and a severe chronic bursitis.   PROCEDURES:  1.  Open decompression, left shoulder.  2.  Lysis of adhesions, left shoulder.  3.  Excision of the subdeltoid bursa, left shoulder.  4.  Exploration of rotator cuff, left shoulder.   DESCRIPTION OF PROCEDURE:  Under general anesthesia, routine orthopedic  prepping and draping of the left shoulder was carried out.  The patient was  given 1 g of IV Ancef.  After a sterile prep and drape had been carried out,  an incision was made over the anterior aspect of the left shoulder.  Bleeders were identified and cauterized.  At this time, I partially detached  the proximal portion of the deltoid tendon and then went down and explored  the rotator cuff.  She had a severe chronic subdeltoid bursitis.  She had  rather significant impingement syndrome.  Her cuff was slightly abraded.  I  protected the rotator cuff with the Bennett retractor and did a partial  acromionectomy with the oscillating saw and then utilized the bur to even  out the undersurface of the acromion.  Following that I excised the  subdeltoid bursa.  I explored the rotator cuff and it was intact, but was  slightly abraded.  I thoroughly irrigated out the area, reapproximated the  deltoid tendon and muscle in the usual fashion.  The subcutaneous was closed  with 0 Vicryl and the skin with metal staples.  Sterile Neosporin dressing  was applied.  She was placed in a sling.  Note, prior to being taken back to  surgery, she had an interscalene block on the left.      RAG/MEDQ  D:  06/23/2004  T:  06/23/2004  Job:  [id number]

## 2010-10-16 NOTE — Op Note (Signed)
NAME:  Brandi Richards, Brandi Richards               ACCOUNT NO.:  [id number]   MEDICAL RECORD NO.:  [id number]          PATIENT TYPE:  INP   LOCATION:  0005                         FACILITY:  [facility]   PHYSICIAN:  Randye Lobo, M.D.   DATE OF BIRTH:  [birth year]   DATE OF PROCEDURE:  DATE OF DISCHARGE:                                 OPERATIVE REPORT   Audio too short to transcribe (less than 5 seconds)      Randye Lobo, M.D.     BES/MEDQ  D:  07/05/2005  T:  07/05/2005  Job:  [id number]

## 2010-10-16 NOTE — Discharge Summary (Signed)
Brandi Richards, Brandi Richards               ACCOUNT NO.:  [id number]   MEDICAL RECORD NO.:  [id number]          PATIENT TYPE:  INP   LOCATION:  1319                         [facility]ACILITY:  [facility]   PHYSICIAN:  Georges Lynch. Gioffre, M.D.DATE OF BIRTH:  23-Aug-1945   DATE OF ADMISSION:  08/06/2008  DATE OF DISCHARGE:  08/08/2008                               DISCHARGE SUMMARY   She was admitted to the hospital and taken to surgery by me on August 06, 2008 because of his severe spinal stenosis at L3-4 and L4-5.  The  surgery was done on August 06, 2008.  I did a decompressive lumbar  laminectomy at L3-4 and L4-L5 for spinal stenosis.  Dr. Simonne Come was  the assistant.  Following day on March 10, she was doing well.  She had  no leg pain.  She had voided.  She had good strength in her legs.  I  ordered her up to ambulate with physical therapy and start her on  aspirin one a day.  August 08, 2008, she was doing well except for slight  tachycardia.  No chest pain.  Oxygen saturation was 99%.  I told her  when she goes home just to notify her M.D. that she was stable.  Her  heart rate was 130.  When I came back to check on her again there was no  cardiac history.  I told her we would just go ahead and get an EKG and I  did notify Dr. Donnie Aho.  Decision then was made that she was fine to go  home.  Her dressing in her back was changed and the wound looked good.   DISCHARGE MEDICATIONS:  Rockne Menghini one every 4 hours p.r.n. for  pain.   LABS ON ADMISSION:  White count was 4600, hemoglobin 15.1, hematocrit  43.9, her hemoglobin was followed sequentially in the hospital.  On  March 11, her hemoglobin was 12.7, hematocrit 37.2.  Differential was  within normal limits.  Her PT was 13.3, INR 1, PTT was 37.  The sodium  144, potassium 4.1, chloride 109, glucose 109 as well and BUN was 13,  creatinine 0.89.  The AST was slightly elevated at 41.  The ALT was  slightly elevated at 53, upper limit of normal for the ALT  was 35, upper  limits of normal for the AST was 37.  The patient will be monitored for  that.  The urinalysis was basically normal.  She had a clean catch urine  culture and basically it was not really a good collected specimen.  There is no reason to treat her for any infection.  The EKG showed  normal sinus rhythm with left axis deviation.  The x-ray of her back was  done for numbering purposes and x-rays were taken in surgery to verify  exact position.  The patient was maintained on PAS hose while in the  hospital.   DISCHARGE DIAGNOSES:  1. Spinal stenosis at L3-4 and L4-L5.  2. Hypertension.  3. Thyroid disease.  4. Asymptomatic diverticulosis.  5. Possible leukopenia.  She is currently being evaluated for  that.  6. Hiatal hernia.   DISCHARGE INSTRUCTIONS:  She can ambulate at home.  I do not want her  doing any lifting more than 5 pounds and she is to change her dressing  daily.  To see me in the office for followup visit as we discussed.   CURRENT MEDICATIONS:  1. Lisinopril 10 mg a day.  2. Hydrochlorothiazide 12.5 mg a day.  3. Crestor 10 mg a day.  4. Synthroid 112 mcg a day.  5. Zantac 150 mg once a day.  6. Dilaudid 2 mg every 4 hours p.r.n. pain.  7. Multivitamins.  8. She is also on calcium.  9. Vitamin B12 1000 mcg a day.   FURTHER DISCHARGE INSTRUCTIONS:  If there are any problems with her  wound or any other abnormalities before I see her in the office in 2  weeks, she should simply call.           ______________________________  Georges Lynch. Darrelyn Hillock, M.D.     RAG/MEDQ  D:  09/16/2008  T:  09/16/2008  Job:  [id number]

## 2010-10-23 ENCOUNTER — Other Ambulatory Visit: Payer: Self-pay | Admitting: Family Medicine

## 2010-10-23 DIAGNOSIS — Z1231 Encounter for screening mammogram for malignant neoplasm of breast: Secondary | ICD-10-CM

## 2010-11-26 ENCOUNTER — Ambulatory Visit
Admission: RE | Admit: 2010-11-26 | Discharge: 2010-11-26 | Disposition: A | Discharge status: Home / Self Care | Payer: Medicare HMO | Source: Ambulatory Visit | Attending: Family Medicine | Admitting: Family Medicine

## 2010-11-26 DIAGNOSIS — Z1231 Encounter for screening mammogram for malignant neoplasm of breast: Secondary | ICD-10-CM

## 2011-10-20 ENCOUNTER — Other Ambulatory Visit (HOSPITAL_COMMUNITY): Payer: Self-pay | Admitting: Family Medicine

## 2011-10-20 DIAGNOSIS — Z1231 Encounter for screening mammogram for malignant neoplasm of breast: Secondary | ICD-10-CM

## 2011-11-30 ENCOUNTER — Ambulatory Visit (HOSPITAL_COMMUNITY)
Admission: RE | Admit: 2011-11-30 | Discharge: 2011-11-30 | Disposition: A | Discharge status: Home / Self Care | Payer: Medicare Other | Source: Ambulatory Visit | Attending: Family Medicine | Admitting: Family Medicine

## 2011-11-30 DIAGNOSIS — Z1231 Encounter for screening mammogram for malignant neoplasm of breast: Secondary | ICD-10-CM | POA: Insufficient documentation

## 2012-07-25 ENCOUNTER — Encounter: Payer: Self-pay | Admitting: Gastroenterology

## 2012-08-26 ENCOUNTER — Telehealth: Payer: Self-pay | Admitting: *Deleted

## 2012-08-26 NOTE — Telephone Encounter (Signed)
Called pt to cancel appointment but no answer. Left a message that her appt scheduled for 09-12-12 has been canceled to please call back and r/s.

## 2012-08-28 ENCOUNTER — Telehealth: Payer: Self-pay | Admitting: *Deleted

## 2012-08-28 NOTE — Telephone Encounter (Signed)
Patient calling to r/s appointment that was canceled.

## 2012-08-28 NOTE — Telephone Encounter (Signed)
Patient is scheduled   

## 2012-09-11 ENCOUNTER — Ambulatory Visit (AMBULATORY_SURGERY_CENTER): Payer: Medicare Other | Admitting: *Deleted

## 2012-09-11 VITALS — Ht 66.0 in | Wt 152.0 lb

## 2012-09-11 DIAGNOSIS — Z86010 Personal history of colonic polyps: Secondary | ICD-10-CM

## 2012-09-11 DIAGNOSIS — Z1211 Encounter for screening for malignant neoplasm of colon: Secondary | ICD-10-CM

## 2012-09-11 MED ORDER — NA SULFATE-K SULFATE-MG SULF 17.5-3.13-1.6 GM/177ML PO SOLN: ORAL | Status: DC

## 2012-09-12 ENCOUNTER — Ambulatory Visit: Payer: Self-pay | Admitting: Nurse Practitioner

## 2012-09-12 ENCOUNTER — Encounter: Payer: Self-pay | Admitting: Gastroenterology

## 2012-09-19 ENCOUNTER — Ambulatory Visit (INDEPENDENT_AMBULATORY_CARE_PROVIDER_SITE_OTHER): Payer: Medicare Other | Admitting: Nurse Practitioner

## 2012-09-19 ENCOUNTER — Encounter: Payer: Self-pay | Admitting: Nurse Practitioner

## 2012-09-19 VITALS — BP 127/73 | HR 86 | Ht 64.0 in | Wt 153.0 lb

## 2012-09-19 DIAGNOSIS — G252 Other specified forms of tremor: Secondary | ICD-10-CM | POA: Insufficient documentation

## 2012-09-19 DIAGNOSIS — G2581 Restless legs syndrome: Secondary | ICD-10-CM

## 2012-09-19 DIAGNOSIS — R252 Cramp and spasm: Secondary | ICD-10-CM

## 2012-09-19 DIAGNOSIS — G250 Essential tremor: Secondary | ICD-10-CM

## 2012-09-19 MED ORDER — ROPINIROLE HCL 0.25 MG PO TABS: 0.2500 mg | ORAL_TABLET | ORAL | Status: DC

## 2012-09-19 MED ORDER — ALPRAZOLAM 0.25 MG PO TABS: 0.2500 mg | ORAL_TABLET | Freq: Three times a day (TID) | ORAL | Status: DC | PRN

## 2012-09-19 NOTE — Patient Instructions (Addendum)
Rating scale for restless legs is 28 which is severe Please began Requip 0.25 mg 2 hours before bed Double dose if necessary after 3 days  Continue baclofen at current dose for now Followup in 2 months

## 2012-09-19 NOTE — Progress Notes (Signed)
HPI: Patient returns for followup after last visit with Dr. Anne Hahn 04/11/2012. She has a history of benign essential tremor and nocturnal leg cramps. The patient indicates that the baclofen at night does help the leg cramps. Her tremor is stable. The patient is on a statin medication for many years and believes that the cramps may have gotten worse on this medication however on further questioning her symptoms sound more like restless leg syndrome because she has an urge to move the legs at night she is awakened by an uncomfortable sensation she can get up and walk around the bed and her symptoms are partially relieved. She scores 28 on the international restless leg syndrome ratings scale which is severe category  ROS:  - cramps, tremor  Physical Exam General: well developed, well nourished, seated, in no evident distress Head: head normocephalic and atraumatic. Oropharynx benign Neck: supple with no carotid or supraclavicular bruits Cardiovascular: regular rate and rhythm, no murmurs  Neurologic Exam Mental Status: Awake and fully alert. Oriented to place and time.  Attention span, concentration and fund of knowledge appropriate. Mood and affect appropriate.  Cranial Nerves:. Pupils equal, briskly reactive to light. Extraocular movements full without nystagmus. Visual fields full to confrontation.Exophthalmos of the right eye noted.  Hearing intact and symmetric to finger snap. Facial sensation intact. Face, tongue, palate move normally and symmetrically. Neck flexion and extension normal.  Motor: Normal bulk and tone. Normal strength in all tested extremity muscles. Sensory.: intact to touch and pinprick and vibratory.  Coordination: Rapid alternating movements normal in all extremities. Finger-to-nose and heel-to-shin performed accurately bilaterally. Gait and Station: Arises from chair without difficulty. Stance is normal. Gait demonstrates normal stride length and balance . Able to heel, toe  and slightly unsteady  with  tandem walk. Romberg negative. Reflexes: 2+ and symmetric. Toes downgoing.     ASSESSMENT: Benign essential tremor in good control on Xanax 0.25 mg up to 3 tabs daily when necessary Nocturnal leg cramps with an urge to move the legs waking her up at night. Walking around the bed relieves some of the symptoms. Could be restless leg syndrome. She scores 28 which is severe on the international restless leg syndrome ratings scale     PLAN: Will begin low-dose Requip .25mg  and increase to 2 tablets if necessary in 3 days. Patient made aware she needs to take the medication 2 hours before bedtime. She will continue the baclofen and the Xanax at present doses. She will followup in 2 months.May order some specific labs for restless legs at that time Fall risk=6, low moderate risk. Nilda Riggs, GNP-BC APRN

## 2012-09-19 NOTE — Progress Notes (Signed)
I have read the note, and I agree with the clinical assessment and plan.  

## 2012-09-26 ENCOUNTER — Ambulatory Visit (AMBULATORY_SURGERY_CENTER): Payer: Medicare Other | Admitting: Gastroenterology

## 2012-09-26 ENCOUNTER — Encounter: Payer: Medicare HMO | Admitting: Gastroenterology

## 2012-09-26 ENCOUNTER — Encounter: Payer: Self-pay | Admitting: Gastroenterology

## 2012-09-26 VITALS — BP 116/75 | HR 60 | Temp 96.7°F | Resp 15 | Ht 64.0 in | Wt 153.0 lb

## 2012-09-26 DIAGNOSIS — D126 Benign neoplasm of colon, unspecified: Secondary | ICD-10-CM

## 2012-09-26 DIAGNOSIS — B6739 Echinococcus granulosus infection, other sites: Secondary | ICD-10-CM

## 2012-09-26 DIAGNOSIS — Z1211 Encounter for screening for malignant neoplasm of colon: Secondary | ICD-10-CM

## 2012-09-26 DIAGNOSIS — Z86010 Personal history of colonic polyps: Secondary | ICD-10-CM

## 2012-09-26 MED ORDER — SODIUM CHLORIDE 0.9 % IV SOLN: 500.0000 mL | INTRAVENOUS | Status: DC

## 2012-09-26 NOTE — Progress Notes (Signed)
Lidocaine-40mg IV prior to Propofol InductionPropofol given over incremental dosages 

## 2012-09-26 NOTE — Progress Notes (Signed)
Called to room to assist during endoscopic procedure.  Patient ID and intended procedure confirmed with present staff. Received instructions for my participation in the procedure from the performing physician.  

## 2012-09-26 NOTE — Progress Notes (Signed)
Patient did not experience any of the following events: a burn prior to discharge; a fall within the facility; wrong site/side/patient/procedure/implant event; or a hospital transfer or hospital admission upon discharge from the facility. (G8907) Patient did not have preoperative order for IV antibiotic SSI prophylaxis. (G8918)  

## 2012-09-26 NOTE — Op Note (Signed)
[facility] [address] [place], [address]   COLONOSCOPY PROCEDURE REPORT  PATIENT: Brandi Richards, Brandi Richards.  MR#: [id number] BIRTHDATE: September 09, 1945 , 66  yrs. old GENDER: Female ENDOSCOPIST: Louis Meckel, MD REFERRED MV:HQIONGE Katrinka Blazing, M.D. PROCEDURE DATE:  09/26/2012 PROCEDURE:   Colonoscopy with snare polypectomy and Colonoscopy with cold biopsy polypectomy ASA CLASS:   Class II INDICATIONS:Patient's personal history of colon polyps. multiple polyps removed 2011. Followup colonoscopy 3 years because of multiplicity of polyps MEDICATIONS: MAC sedation, administered by CRNA and propofol (Diprivan) 300mg  IV  DESCRIPTION OF PROCEDURE:   After the risks benefits and alternatives of the procedure were thoroughly explained, informed consent was obtained.  A digital rectal exam revealed no abnormalities of the rectum.   The LB CF-[id number]  endoscope was introduced through the anus and advanced to the cecum, which was identified by the ileocecal valve. No adverse events experienced.   The quality of the prep was Suprep fair  The instrument was then slowly withdrawn as the colon was fully examined.      COLON FINDINGS: A sessile polyp measuring 2 mm in size was found at the cecum.  A polypectomy was performed with cold forceps.  The resection was complete and the polyp tissue was completely retrieved.   A sessile polyp measuring 2 mm in size was found in the ascending colon.  A polypectomy was performed with cold forceps.  The resection was complete and the polyp tissue was completely retrieved.   Two sessile polyps measuring 3 mm in size were found in the ascending colon.  A polypectomy was performed with a cold snare.  The resection was complete and the polyp tissue was completely retrieved.   A sessile polyp measuring 3 mm in size was found in the transverse colon.  A polypectomy was performed with a cold snare.  The resection was complete and the polyp  tissue was completely retrieved.   Two sessile polyps measuring 3 mm in size were found at the splenic flexure.  A polypectomy was performed with a cold snare.  The resection was complete and the polyp tissue was completely retrieved.   A sessile polyp measuring 3 mm in size was found in the descending colon.  A polypectomy was performed with a cold snare.  The resection was complete and the polyp tissue was completely retrieved.   The colon mucosa was otherwise normal.  Retroflexed views revealed no abnormalities. The time to cecum=8 minutes 38 seconds.  Withdrawal time=20 minutes 37 seconds.  The scope was withdrawn and the procedure completed. COMPLICATIONS: There were no complications.  ENDOSCOPIC IMPRESSION: 1.   Sessile polyp measuring 2 mm in size was found at the cecum; polypectomy was performed with cold forceps 2.   Sessile polyp measuring 2 mm in size was found in the ascending colon; polypectomy was performed with cold forceps 3.   Two sessile polyps measuring 3 mm in size were found in the ascending colon; polypectomy was performed with a cold snare 4.   Sessile polyp measuring 3 mm in size was found in the transverse colon; polypectomy was performed with a cold snare 5.   Two sessile polyps measuring 3 mm in size were found at the splenic flexure; polypectomy was performed with a cold snare 6.   Sessile polyp measuring 3 mm in size was found in the descending colon; polypectomy was performed with a cold snare 7.   The colon mucosa was otherwise normal  RECOMMENDATIONS: If the polyp(s)  removed today are proven to be adenomatous (pre-cancerous) polyps, you will need a colonoscopy in 3 years. Otherwise you should continue to follow colorectal cancer screening guidelines for "routine risk" patients with a colonoscopy in 10 years.  You will receive a letter within 1-2 weeks with the results of your biopsy as well as final recommendations.  Please call my office if you have  not received a letter after 3 weeks.   eSigned:  Louis Meckel, MD 09/26/2012 9:29 AM   cc:   PATIENT NAME:  Brandi Richards. MR#: [id number]

## 2012-09-26 NOTE — Patient Instructions (Addendum)
YOU HAD AN ENDOSCOPIC PROCEDURE TODAY AT THE [facility]: Refer to the procedure report that was given to you for any specific questions about what was found during the examination.  If the procedure report does not answer your questions, please call your gastroenterologist to clarify.  If you requested that your care partner not be given the details of your procedure findings, then the procedure report has been included in a sealed envelope for you to review at your convenience later.  YOU SHOULD EXPECT: Some feelings of bloating in the abdomen. Passage of more gas than usual.  Walking can help get rid of the air that was put into your GI tract during the procedure and reduce the bloating. If you had a lower endoscopy (such as a colonoscopy or flexible sigmoidoscopy) you may notice spotting of blood in your stool or on the toilet paper. If you underwent a bowel prep for your procedure, then you may not have a normal bowel movement for a few days.  DIET: Your first meal following the procedure should be a light meal and then it is ok to progress to your normal diet.  A half-sandwich or bowl of soup is an example of a good first meal.  Heavy or fried foods are harder to digest and may make you feel nauseous or bloated.  Likewise meals heavy in dairy and vegetables can cause extra gas to form and this can also increase the bloating.  Drink plenty of fluids but you should avoid alcoholic beverages for 24 hours.  ACTIVITY: Your care partner should take you home directly after the procedure.  You should plan to take it easy, moving slowly for the rest of the day.  You can resume normal activity the day after the procedure however you should NOT DRIVE or use heavy machinery for 24 hours (because of the sedation medicines used during the test).    SYMPTOMS TO REPORT IMMEDIATELY: A gastroenterologist can be reached at any hour.  During normal business hours, 8:30 AM to 5:00 PM Monday through Friday,  call [phone].  After hours and on weekends, please call the GI answering service at [phone] who will take a message and have the physician on call contact you.   Following lower endoscopy (colonoscopy or flexible sigmoidoscopy):  Excessive amounts of blood in the stool  Significant tenderness or worsening of abdominal pains  Swelling of the abdomen that is new, acute  Fever of 100F or higher  Following upper endoscopy (EGD)  Vomiting of blood or coffee ground material  New chest pain or pain under the shoulder blades  Painful or persistently difficult swallowing  New shortness of breath  Fever of 100F or higher  Black, tarry-looking stools  FOLLOW UP: If any biopsies were taken you will be contacted by phone or by letter within the next 1-3 weeks.  Call your gastroenterologist if you have not heard about the biopsies in 3 weeks.  Our staff will call the home number listed on your records the next business day following your procedure to check on you and address any questions or concerns that you may have at that time regarding the information given to you following your procedure. This is a courtesy call and so if there is no answer at the home number and we have not heard from you through the emergency physician on call, we will assume that you have returned to your regular daily activities without incident.  SIGNATURES/CONFIDENTIALITY: You and/or your care   partner have signed paperwork which will be entered into your electronic medical record.  These signatures attest to the fact that that the information above on your After Visit Summary has been reviewed and is understood.  Full responsibility of the confidentiality of this discharge information lies with you and/or your care-partner.  

## 2012-09-27 ENCOUNTER — Telehealth: Payer: Self-pay | Admitting: *Deleted

## 2012-09-27 NOTE — Telephone Encounter (Signed)
  Follow up Call-  Call back number 09/26/2012  Post procedure Call Back phone  # [phone]  Permission to leave phone message Yes     Patient questions:  Do you have a fever, pain , or abdominal swelling? no Pain Score  0 *  Have you tolerated food without any problems? yes  Have you been able to return to your normal activities? yes  Do you have any questions about your discharge instructions: Diet   no Medications  no Follow up visit  no  Do you have questions or concerns about your Care? no  Actions: * If pain score is 4 or above: No action needed, pain <4.

## 2012-10-06 ENCOUNTER — Encounter: Payer: Self-pay | Admitting: Gastroenterology

## 2012-10-30 ENCOUNTER — Other Ambulatory Visit (HOSPITAL_COMMUNITY): Payer: Self-pay | Admitting: Family Medicine

## 2012-10-30 DIAGNOSIS — Z1231 Encounter for screening mammogram for malignant neoplasm of breast: Secondary | ICD-10-CM

## 2012-12-04 ENCOUNTER — Ambulatory Visit (INDEPENDENT_AMBULATORY_CARE_PROVIDER_SITE_OTHER): Payer: Medicare Other | Admitting: Nurse Practitioner

## 2012-12-04 ENCOUNTER — Encounter: Payer: Self-pay | Admitting: Nurse Practitioner

## 2012-12-04 VITALS — BP 119/77 | HR 95 | Ht 64.0 in | Wt 156.0 lb

## 2012-12-04 DIAGNOSIS — G250 Essential tremor: Secondary | ICD-10-CM

## 2012-12-04 DIAGNOSIS — G252 Other specified forms of tremor: Secondary | ICD-10-CM

## 2012-12-04 DIAGNOSIS — G2581 Restless legs syndrome: Secondary | ICD-10-CM

## 2012-12-04 DIAGNOSIS — R252 Cramp and spasm: Secondary | ICD-10-CM

## 2012-12-04 MED ORDER — ROPINIROLE HCL 0.25 MG PO TABS: 1.0000 mg | ORAL_TABLET | Freq: Every day | ORAL | Status: DC

## 2012-12-04 NOTE — Progress Notes (Signed)
I have read the note, and I agree with the clinical assessment and plan.  

## 2012-12-04 NOTE — Progress Notes (Signed)
HPI: Followup after last visit 09/19/2012. She has a history of benign essential tremor and nocturnal leg cramps. The patient indicates that the baclofen at night does help the leg cramps. Her tremor is a little worse. Recent thyroid level was .3 with adjustment in medication She continues to have a urge to move the legs at night she is awakened by an uncomfortable sensation she can get up and walk around the bed and her symptoms are partially relieved. She scored 28 on the international restless leg syndrome ratings scale which is severe category at her last visit. She was placed on Requip currently taking 0.5 mg 2 hours before bedtime. This has improved her symptoms but not completely. She has not had ferratin or iron studies done   ROS:  Leg cramps, tremor, restless legs  Physical Exam General: well developed, well nourished, seated, in no evident distress Head: head normocephalic and atraumatic. Oropharynx benign Neck: supple with no carotid  bruits Cardiovascular: regular rate and rhythm, no murmurs  Neurologic Exam Mental Status: Awake and fully alert. Oriented to place and time. Follows all commands. Speech and language normal.   Cranial Nerves:. Pupils equal, briskly reactive to light. Extraocular movements full without nystagmus. Visual fields full to confrontation. Hearing intact and symmetric to finger snap. Facial sensation intact. Face, tongue, palate move normally and symmetrically. Neck flexion and extension normal.  Motor: Normal bulk and tone. Normal strength in all tested extremity muscles.No focal weakness Sensory.: intact to touch and pinprick and vibratory.  Coordination: Rapid alternating movements normal in all extremities. Finger-to-nose and heel-to-shin performed accurately bilaterally. Gait and Station: Arises from chair without difficulty. Stance is normal.  Able to heel, toe and tandem walk without difficulty.  Reflexes: 2+ and symmetric. Toes downgoing.      ASSESSMENT: Benign essential tremor fair control Nocturnal leg cramps with the urge to move the legs waking her up at night, walking around in bed relieves her symptoms, probable restless leg syndrome Reviewd CBC, CMP, and TSH from Dr. Michaelle Copas office all normal except for TSH 0.3     PLAN: Check ferritin level, iron and TIBC Given educational material on ferritin and iron stores. Increase Requip to 1 mg 2 hours before bedtime will refill Followup in 3 months  Nilda Riggs, GNP-BC APRN

## 2012-12-04 NOTE — Patient Instructions (Addendum)
Check ferritin level, iron and TIBC Increase Requip to 1 mg 2 hours before bedtime Followup in 3 months

## 2012-12-05 ENCOUNTER — Telehealth: Payer: Self-pay | Admitting: *Deleted

## 2012-12-05 ENCOUNTER — Ambulatory Visit (HOSPITAL_COMMUNITY)
Admission: RE | Admit: 2012-12-05 | Discharge: 2012-12-05 | Disposition: A | Discharge status: Home / Self Care | Payer: Medicare Other | Source: Ambulatory Visit | Attending: Family Medicine | Admitting: Family Medicine

## 2012-12-05 DIAGNOSIS — Z1231 Encounter for screening mammogram for malignant neoplasm of breast: Secondary | ICD-10-CM | POA: Insufficient documentation

## 2012-12-05 LAB — IRON AND TIBC
Iron: 103 ug/dL (ref 35–155)
UIBC: 205 ug/dL (ref 150–375)

## 2012-12-05 LAB — FERRITIN: Ferritin: 49 ng/mL (ref 15–150)

## 2012-12-05 NOTE — Telephone Encounter (Signed)
I spoke to patient and relayed lab results, per Eber Jones.  She asked for an new prescription for Requip with the new dosage, she said insurance won't pay if she tries to fill it more than once a month.

## 2012-12-05 NOTE — Progress Notes (Signed)
Quick Note:  I spoke to patient and relayed lab results and told her to take Requip 1mg  hs. ______

## 2012-12-06 NOTE — Telephone Encounter (Signed)
I spoke to patient and she said it was fine to keep the 0.25mg  tab 4 daily.

## 2012-12-06 NOTE — Telephone Encounter (Signed)
I already renewed her RX for .25mg  4 tabs daily. Does she want it changed to the 1mg  tab.

## 2012-12-07 ENCOUNTER — Other Ambulatory Visit (HOSPITAL_COMMUNITY): Payer: Self-pay | Admitting: Family Medicine

## 2012-12-07 DIAGNOSIS — M899 Disorder of bone, unspecified: Secondary | ICD-10-CM

## 2012-12-14 ENCOUNTER — Telehealth: Payer: Self-pay | Admitting: Neurology

## 2012-12-15 NOTE — Telephone Encounter (Signed)
TC to patient. She was not taking the medication 2 hrs before bed and for the last 2 nights has taken it that way with no problems during the night. She is sorry for the phone call. She will call back for further problems.

## 2012-12-19 ENCOUNTER — Ambulatory Visit (HOSPITAL_COMMUNITY)
Admission: RE | Admit: 2012-12-19 | Discharge: 2012-12-19 | Disposition: A | Discharge status: Home / Self Care | Payer: Medicare Other | Source: Ambulatory Visit | Attending: Family Medicine | Admitting: Family Medicine

## 2012-12-19 DIAGNOSIS — Z780 Asymptomatic menopausal state: Secondary | ICD-10-CM | POA: Insufficient documentation

## 2012-12-19 DIAGNOSIS — M899 Disorder of bone, unspecified: Secondary | ICD-10-CM

## 2012-12-19 DIAGNOSIS — Z13820 Encounter for screening for osteoporosis: Secondary | ICD-10-CM | POA: Insufficient documentation

## 2012-12-21 ENCOUNTER — Telehealth: Payer: Self-pay | Admitting: Neurology

## 2012-12-21 MED ORDER — BACLOFEN 10 MG PO TABS: 30.0000 mg | ORAL_TABLET | Freq: Every day | ORAL | Status: DC

## 2012-12-21 NOTE — Telephone Encounter (Signed)
I called patient. The patient was placed on ropinirole for restless leg syndrome, but the patient indicates that she is having nocturnal leg cramps. The patient goes to sleep easily, but wakes up hour after he goes to sleep with cramps in the calf muscle, one side or the other or both sides together. The patient indicates that she is to stretch out her calf muscle to alleviate the pain. The patient is on baclofen, and magnesium. The patient is on hydrochlorothiazide, but apparently she had recent blood work done through her primary care physician. The patient will be taken off of the ropinirole, and the baclofen will be increased to 30 mg at night.

## 2012-12-21 NOTE — Telephone Encounter (Signed)
Patient was up with leg cramps all last night. Says she has been taking meds and exercising as directed. Requesting to know if there is anything else she can do to find relief. Says she is very tired this a.m because she did not get any sleep.

## 2013-01-02 ENCOUNTER — Other Ambulatory Visit: Payer: Self-pay | Admitting: Nurse Practitioner

## 2013-01-03 NOTE — Telephone Encounter (Signed)
Rx signed and faxed.

## 2013-01-03 NOTE — Telephone Encounter (Signed)
Rx sent 

## 2013-03-06 ENCOUNTER — Ambulatory Visit: Payer: Medicare Other | Admitting: Nurse Practitioner

## 2013-03-20 ENCOUNTER — Encounter: Payer: Self-pay | Admitting: Neurology

## 2013-03-20 ENCOUNTER — Ambulatory Visit (INDEPENDENT_AMBULATORY_CARE_PROVIDER_SITE_OTHER): Payer: Medicare Other | Admitting: Neurology

## 2013-03-20 ENCOUNTER — Encounter (INDEPENDENT_AMBULATORY_CARE_PROVIDER_SITE_OTHER): Payer: Self-pay

## 2013-03-20 VITALS — BP 116/78 | HR 64 | Wt 156.0 lb

## 2013-03-20 DIAGNOSIS — Z5181 Encounter for therapeutic drug level monitoring: Secondary | ICD-10-CM

## 2013-03-20 DIAGNOSIS — G2581 Restless legs syndrome: Secondary | ICD-10-CM

## 2013-03-20 DIAGNOSIS — G252 Other specified forms of tremor: Secondary | ICD-10-CM

## 2013-03-20 DIAGNOSIS — R252 Cramp and spasm: Secondary | ICD-10-CM

## 2013-03-20 DIAGNOSIS — G250 Essential tremor: Secondary | ICD-10-CM

## 2013-03-20 MED ORDER — BACLOFEN 20 MG PO TABS: 40.0000 mg | ORAL_TABLET | Freq: Every day | ORAL | Status: DC

## 2013-03-20 NOTE — Progress Notes (Signed)
Reason for visit: Cramps  Brandi Richards is an 67 y.o. female  History of present illness:  Brandi Richards is a 67 year old right-handed white female with a history of nocturnal leg cramps and a history of an essential tremor. The patient is on baclofen at night for the leg cramps, taking 30 mg at night. Initially, the increase in the dose seemed to help, but the patient has had leg cramps almost every other night over the last week. The patient is not sleeping well because of this. The patient is on Zocor taking 40 mg daily. The patient is taking magnesium supplementation, as well as calcium and vitamin D. The patient takes alprazolam 0.25 mg at night as well. The patient returns for an evaluation. The patient continues to have some troubles with tremors, and she is using a spoon to eat as this makes eating less of a problem with the tremor.  Past Medical History  Diagnosis Date  . Thyroid disease   . Hypertension   . Hyperlipidemia   . Movement disorder   . Nocturnal leg cramps   . Restless leg syndrome     Past Surgical History  Procedure Laterality Date  . Cholecystectomy    . Partial hysterectomy    . Shoulder surgery    . Back surgery    . Tubal ligation    . Tonsillectomy    . Nasal sinus surgery      Family History  Problem Relation Age of Onset  . Colon cancer Neg Hx     Social history:  reports that she has never smoked. She has never used smokeless tobacco. She reports that she does not drink alcohol or use illicit drugs.    Allergies  Allergen Reactions  . Sulfonamide Derivatives     REACTION: rash    Medications:  Current Outpatient Prescriptions on File Prior to Visit  Medication Sig Dispense Refill  . ALPRAZolam (XANAX) 0.25 MG tablet TAKE ONE TABLET BY MOUTH THREE TIMES DAILY AS NEEDED  60 tablet  3  . Calcium Carbonate-Vitamin D (CALCIUM + D PO) Take 600 mg by mouth daily.      . Cholecalciferol (VITAMIN D) 2000 UNITS tablet Take 2,000 Units by mouth  daily.      . hydrochlorothiazide (HYDRODIURIL) 25 MG tablet Take 12.5 mg by mouth daily.      Marland Kitchen levothyroxine (SYNTHROID, LEVOTHROID) 112 MCG tablet Take 112 mcg by mouth daily before breakfast.      . lisinopril (PRINIVIL,ZESTRIL) 10 MG tablet Take 5 mg by mouth at bedtime.       . Magnesium 400 MG CAPS Take 1 capsule by mouth 2 (two) times daily.      . Multiple Vitamin (MULTIVITAMIN) tablet Take 1 tablet by mouth daily.      . Multiple Vitamins-Minerals (HAIR/SKIN/NAILS PO) Take 2 tablets by mouth 2 (two) times daily.      . ranitidine (ZANTAC) 150 MG tablet Take 150 mg by mouth daily.      . simvastatin (ZOCOR) 40 MG tablet Take 40 mg by mouth at bedtime.       No current facility-administered medications on file prior to visit.    ROS:  Out of a complete 14 system review of symptoms, the patient complains only of the following symptoms, and all other reviewed systems are negative.  Muscle cramps Tremor  Blood pressure 116/78, pulse 64, weight 156 lb (70.761 kg).  Physical Exam  General: The patient is alert and cooperative at  the time of the examination.  Skin: No significant peripheral edema is noted.   Neurologic Exam  Mental status: The patient is oriented x 3.  Cranial nerves: Facial symmetry is present. Speech is normal, no aphasia or dysarthria is noted. Extraocular movements are full. Visual fields are full.  Motor: The patient has good strength in all 4 extremities.  Sensory examination: Soft touch sensation is symmetric on the face, arms, and legs.  Coordination: The patient has good finger-nose-finger and heel-to-shin bilaterally. An intention tremor is seen with the arms.  Gait and station: The patient has a normal gait. Tandem gait is normal. Romberg is negative. No drift is seen.  Reflexes: Deep tendon reflexes are symmetric.   Assessment/Plan:  One. Nocturnal leg cramps  2. Restless leg syndrome  3. Benign essential tremor  The patient will be  placed on 40 mg a baclofen at night. If this is not effective, the patient will be given a trial on quinine. The patient will have blood work today looking at electrolytes to include a magnesium level. The patient will followup in 4 months. The leg cramps are a significant issue for the patient currently. Changing the statin medication may be potentially helpful, as Crestor may have fewer muscle side effects.  Marlan Palau MD 03/20/2013 7:46 PM  [facility] [address] [place], [place] [address]  Phone [phone] Fax [phone]

## 2013-04-06 ENCOUNTER — Telehealth: Payer: Self-pay | Admitting: *Deleted

## 2013-04-06 MED ORDER — QUININE SULFATE 324 MG PO CAPS: 324.0000 mg | ORAL_CAPSULE | Freq: Every day | ORAL | Status: DC

## 2013-04-06 NOTE — Telephone Encounter (Signed)
I called patient. The patient is on 40 mg a baclofen at night, still having some cramps. I will try to get quinine ordered. The patient indicates that her insurance would not cover, and it will likely require preapproval. We will try to do this.

## 2013-04-09 ENCOUNTER — Other Ambulatory Visit: Payer: Self-pay | Admitting: Neurology

## 2013-04-09 NOTE — Telephone Encounter (Signed)
Pt's prescription was faxed over to [facility] at [phone].

## 2013-04-25 ENCOUNTER — Telehealth: Payer: Self-pay

## 2013-04-25 ENCOUNTER — Encounter: Payer: Self-pay | Admitting: Neurology

## 2013-04-25 NOTE — Telephone Encounter (Signed)
I will write a letter trying to get this medication covered.

## 2013-04-25 NOTE — Telephone Encounter (Signed)
[employer] mailed a letter stating they will not approve our request for coverage of Quinine.  They do not list any formulary alternatives.  If you would like to try and appeal the denial, a letter of med Brandi Richards may be written and mailed to [facility] or PPO Appeals and Grievance Unit [address] [address] [place], [place] [address].  Otherwise, they request that we change this patient to a different medication.  Please advise,  Thank you.

## 2013-04-27 ENCOUNTER — Other Ambulatory Visit: Payer: Self-pay | Admitting: Neurology

## 2013-04-30 ENCOUNTER — Telehealth: Payer: Self-pay | Admitting: Neurology

## 2013-04-30 NOTE — Telephone Encounter (Signed)
Duplicate request.  This was already taken care of earlier today.

## 2013-06-26 ENCOUNTER — Other Ambulatory Visit: Payer: Self-pay | Admitting: Neurology

## 2013-06-26 MED ORDER — ALPRAZOLAM 0.25 MG PO TABS: 0.2500 mg | ORAL_TABLET | Freq: Three times a day (TID) | ORAL | Status: DC | PRN

## 2013-06-26 MED ORDER — BACLOFEN 20 MG PO TABS: 40.0000 mg | ORAL_TABLET | Freq: Every day | ORAL | Status: DC

## 2013-06-26 NOTE — Telephone Encounter (Signed)
Patient has changed insurances - She needs authorization faxed over to [facility] for the baclofen 20 mg and the alprazolam 0.25

## 2013-06-26 NOTE — Telephone Encounter (Signed)
Patient has changed ins with the new year.  She needs her Rx's sent to [facility], which requires a 90 day Rx.

## 2013-06-27 NOTE — Telephone Encounter (Signed)
Rx signed and faxed.

## 2013-09-10 ENCOUNTER — Encounter: Payer: Self-pay | Admitting: Neurology

## 2013-09-12 ENCOUNTER — Encounter (INDEPENDENT_AMBULATORY_CARE_PROVIDER_SITE_OTHER): Payer: Self-pay

## 2013-09-12 ENCOUNTER — Ambulatory Visit (INDEPENDENT_AMBULATORY_CARE_PROVIDER_SITE_OTHER): Payer: Commercial Managed Care - HMO | Admitting: Neurology

## 2013-09-12 ENCOUNTER — Encounter: Payer: Self-pay | Admitting: Neurology

## 2013-09-12 VITALS — BP 125/86 | HR 81 | Ht 64.0 in | Wt 155.0 lb

## 2013-09-12 DIAGNOSIS — G2581 Restless legs syndrome: Secondary | ICD-10-CM

## 2013-09-12 DIAGNOSIS — R252 Cramp and spasm: Secondary | ICD-10-CM

## 2013-09-12 DIAGNOSIS — G250 Essential tremor: Secondary | ICD-10-CM

## 2013-09-12 DIAGNOSIS — G252 Other specified forms of tremor: Principal | ICD-10-CM

## 2013-09-12 MED ORDER — PRIMIDONE 50 MG PO TABS: 50.0000 mg | ORAL_TABLET | Freq: Every day | ORAL | Status: DC

## 2013-09-12 NOTE — Patient Instructions (Signed)
Tremor  Tremor is a rhythmic, involuntary muscular contraction characterized by oscillations (to-and-fro movements) of a part of the body. The most common of all involuntary movements, tremor can affect various body parts such as the hands, head, facial structures, vocal cords, trunk, and legs; most tremors, however, occur in the hands. Tremor often accompanies neurological disorders associated with aging. Although the disorder is not life-threatening, it can be responsible for functional disability and social embarrassment.  TREATMENT   There are many types of tremor and several ways in which tremor is classified. The most common classification is by behavioral context or position. There are five categories of tremor within this classification: resting, postural, kinetic, task-specific, and psychogenic. Resting or static tremor occurs when the muscle is at rest, for example when the hands are lying on the lap. This type of tremor is often seen in patients with Parkinson's disease. Postural tremor occurs when a patient attempts to maintain posture, such as holding the hands outstretched. Postural tremors include physiological tremor, essential tremor, tremor with basal ganglia disease (also seen in patients with Parkinson's disease), cerebellar postural tremor, tremor with peripheral neuropathy, post-traumatic tremor, and alcoholic tremor. Kinetic or intention (action) tremor occurs during purposeful movement, for example during finger-to-nose testing. Task-specific tremor appears when performing goal-oriented tasks such as handwriting, speaking, or standing. This group consists of primary writing tremor, vocal tremor, and orthostatic tremor. Psychogenic tremor occurs in both older and younger patients. The key feature of this tremor is that it dramatically lessens or disappears when the patient is distracted.  PROGNOSIS  There are some treatment options available for tremor; the appropriate treatment depends on  accurate diagnosis of the cause. Some tremors respond to treatment of the underlying condition, for example in some cases of psychogenic tremor treating the patient's underlying mental problem may cause the tremor to disappear. Also, patients with tremor due to Parkinson's disease may be treated with Levodopa drug therapy. Symptomatic drug therapy is available for several other tremors as well. For those cases of tremor in which there is no effective drug treatment, physical measures such as teaching the patient to brace the affected limb during the tremor are sometimes useful. Surgical intervention such as thalamotomy or deep brain stimulation may be useful in certain cases.  Document Released: 05/07/2002 Document Revised: 08/09/2011 Document Reviewed: 05/17/2005  [employer]® Patient Information ©2014 [employer].

## 2013-09-12 NOTE — Progress Notes (Signed)
PATIENT: Brandi Richards DOB: [birth year]  REASON FOR VISIT: follow up HISTORY FROM: patient  HISTORY OF PRESENT ILLNESS: Brandi Richards is a 68 year old right-handed white female with a history of nocturnal leg cramps and essential tremor. At the last visit baclofen was increased to 40 mg, but she called and stated that was not helping. Patient was placed on quinine but was unable to get it approved by insurance. Patient continues baclofen for now. Her PCP changed her cholesterol medication and that has helped quite a bit. Patient states that she is able to sleep at night now. Her last leg cramp was Monday night. Patient states essential tremor is becoming annoying especially when she is out in public eating. She saw a neurologist in [facility] that tried her on a variety of medication but she is willing to try something again. She is unsure of the medications that was tried in the past. No new medical issues since the last visit.   REVIEW OF SYSTEMS: Full 14 system review of systems performed and notable only for:  Constitutional: N/A  Cardiovascular: N/A  Ear/Nose/Throat: N/A  Skin: N/A  Eyes: N/A  Respiratory: N/A  Gastrointestinal: N/A  Genitourinary: N/A Hematology/Lymphatic: N/A  Endocrine: N/A Musculoskeletal:muscle cramps Allergy/Immunology: N/A  Neurological: tremors Psychiatric: N/A Sleep: N/A   ALLERGIES: Allergies  Allergen Reactions  . Sulfonamide Derivatives     REACTION: rash    HOME MEDICATIONS: Outpatient Prescriptions Prior to Visit  Medication Sig Dispense Refill  . ALPRAZolam (XANAX) 0.25 MG tablet Take 1 tablet (0.25 mg total) by mouth 3 (three) times daily as needed.  270 tablet  1  . aspirin 81 MG tablet Take 81 mg by mouth daily.      . baclofen (LIORESAL) 20 MG tablet Take 2 tablets (40 mg total) by mouth at bedtime.  180 tablet  1  . Calcium Carbonate-Vitamin D (CALCIUM + D PO) Take 600 mg by mouth daily.      . Cholecalciferol (VITAMIN D) 2000  UNITS tablet Take 2,000 Units by mouth daily.      . hydrochlorothiazide (HYDRODIURIL) 25 MG tablet Take 12.5 mg by mouth daily.      Marland Kitchen levothyroxine (SYNTHROID, LEVOTHROID) 112 MCG tablet Take 112 mcg by mouth daily before breakfast.      . lisinopril (PRINIVIL,ZESTRIL) 10 MG tablet Take 5 mg by mouth at bedtime.       . Magnesium 400 MG CAPS Take 1 capsule by mouth 2 (two) times daily.      . Multiple Vitamin (MULTIVITAMIN) tablet Take 1 tablet by mouth daily.      . Multiple Vitamins-Minerals (HAIR/SKIN/NAILS PO) Take 2 tablets by mouth 2 (two) times daily.      . quiNINE (QUALAQUIN) 324 MG capsule Take 1 capsule (324 mg total) by mouth at bedtime.  30 capsule  3  . ranitidine (ZANTAC) 150 MG tablet Take 150 mg by mouth daily.      . simvastatin (ZOCOR) 40 MG tablet Take 40 mg by mouth at bedtime.       No facility-administered medications prior to visit.     PHYSICAL EXAM  Filed Vitals:   09/12/13 0747  BP: 125/86  Pulse: 81  Height: 5\' 4"  (1.626 m)  Weight: 155 lb (70.308 kg)   Body mass index is 26.59 kg/(m^2).  Generalized: Well developed, in no acute distress    Neurological examination  Mentation: Alert oriented to time, place, history taking. Follows all commands speech and language fluent.  Has a tremor in her voice. Cranial nerve II-XII: Extraocular movements were full, visual field were full on confrontational test. Facial sensation and strength were normal.  head turning and shoulder shrug and were normal and symmetric Motor: The motor testing reveals 5 over 5 strength of all 4 extremities. Good symmetric motor tone is noted throughout.  Sensory: Sensory testing is intact to pinprick, soft touch, vibration sensation, and position sense on all 4 extremities. No evidence of extinction is noted.  Coordination: Cerebellar testing reveals good finger-nose-finger and heel-to-shin bilaterally. Intention tremor in bilateral upper extremities. Gait and station: Gait is normal.  Tandem gait minimally unsteady. Romberg is negative. No drift is seen.  Reflexes: Deep tendon reflexes are symmetric and normal bilaterally.   DIAGNOSTIC DATA (LABS, IMAGING, TESTING) - I reviewed patient records, labs, notes, testing and imaging myself where available.  ASSESSMENT AND PLAN 68 y.o. year old female  has a past medical history of Thyroid disease; Hypertension; Hyperlipidemia; Movement disorder; Nocturnal leg cramps; and Restless leg syndrome. here with:  1. Essential and other specified forms of tremor  2. Cramp of limb  3. Restless legs syndrome (RLS)  Will start primidone 50 mg PO at bedtime for four weeks if this is well tolerated we can increase the dose to 50 mg BID if needed.  Continue taking baclofen. Patient requested to try one tablet at night (20 mg) of baclofen since statin medication was changed & decreased. That is fine, if cramps return at night, you can increase the amount back to 2 tablets (40 mg) at bedtime.  Follow up in 6 months or sooner if needed.    No orders of the defined types were placed in this encounter.    No Follow-up on file.  Ward Givens, MSN, NP-C 09/12/2013, 8:02 AM [facility] [address] [place], [place] [address] [phone]  Note: This document was prepared with digital dictation and possible smart phrase technology. Any transcriptional errors that result from this process are unintentional. Kathrynn Ducking

## 2013-09-17 ENCOUNTER — Telehealth: Payer: Self-pay | Admitting: Neurology

## 2013-09-17 MED ORDER — PROPRANOLOL HCL 10 MG PO TABS: ORAL_TABLET | ORAL | Status: DC

## 2013-09-17 NOTE — Telephone Encounter (Signed)
I called patient. The patient not able tolerate the Mysoline secondary to nausea and vomiting. We will give a trial on Inderal.

## 2013-09-17 NOTE — Telephone Encounter (Signed)
Pt called states she took 1 pill of her primidone (MYSOLINE) 50 MG tablet and said it made her sick. Pt states she is not taking any more of these and to have nurse give her a call back concerning this matter. Thanks

## 2013-09-17 NOTE — Telephone Encounter (Signed)
Pt calling stating that she took 1 pill of her primidone 50 mg and it made her sick and pt stated that she was not taking anymore of this medication. Please advise

## 2013-10-30 ENCOUNTER — Other Ambulatory Visit (HOSPITAL_COMMUNITY): Payer: Self-pay | Admitting: Family Medicine

## 2013-10-30 DIAGNOSIS — Z1231 Encounter for screening mammogram for malignant neoplasm of breast: Secondary | ICD-10-CM

## 2013-11-19 ENCOUNTER — Other Ambulatory Visit: Payer: Self-pay | Admitting: Neurology

## 2013-11-19 MED ORDER — BACLOFEN 20 MG PO TABS: 40.0000 mg | ORAL_TABLET | Freq: Every day | ORAL | Status: DC

## 2013-11-19 MED ORDER — ALPRAZOLAM 0.25 MG PO TABS: 0.2500 mg | ORAL_TABLET | Freq: Three times a day (TID) | ORAL | Status: DC | PRN

## 2013-11-19 NOTE — Telephone Encounter (Signed)
Request sent to provider for approval.  

## 2013-11-19 NOTE — Telephone Encounter (Signed)
Patient calling to request refills of Alprazolam and Baclofen to be sent to [facility].

## 2013-11-19 NOTE — Telephone Encounter (Signed)
Rx signed and faxed.

## 2013-12-11 ENCOUNTER — Ambulatory Visit (HOSPITAL_COMMUNITY)
Admission: RE | Admit: 2013-12-11 | Discharge: 2013-12-11 | Disposition: A | Discharge status: Home / Self Care | Payer: Medicare HMO | Source: Ambulatory Visit | Attending: Family Medicine | Admitting: Family Medicine

## 2013-12-11 DIAGNOSIS — Z1231 Encounter for screening mammogram for malignant neoplasm of breast: Secondary | ICD-10-CM | POA: Insufficient documentation

## 2013-12-13 ENCOUNTER — Encounter: Payer: Self-pay | Admitting: Neurology

## 2013-12-13 ENCOUNTER — Telehealth: Payer: Self-pay | Admitting: Neurology

## 2013-12-13 ENCOUNTER — Ambulatory Visit (INDEPENDENT_AMBULATORY_CARE_PROVIDER_SITE_OTHER): Payer: Commercial Managed Care - HMO | Admitting: Neurology

## 2013-12-13 VITALS — BP 139/90 | HR 93 | Wt 156.0 lb

## 2013-12-13 DIAGNOSIS — G2581 Restless legs syndrome: Secondary | ICD-10-CM

## 2013-12-13 DIAGNOSIS — H02409 Unspecified ptosis of unspecified eyelid: Secondary | ICD-10-CM

## 2013-12-13 DIAGNOSIS — G250 Essential tremor: Secondary | ICD-10-CM

## 2013-12-13 DIAGNOSIS — H02402 Unspecified ptosis of left eyelid: Secondary | ICD-10-CM

## 2013-12-13 DIAGNOSIS — R252 Cramp and spasm: Secondary | ICD-10-CM

## 2013-12-13 DIAGNOSIS — G252 Other specified forms of tremor: Secondary | ICD-10-CM

## 2013-12-13 NOTE — Progress Notes (Signed)
Reason for visit: Ptosis  Brandi Richards is an 68 y.o. female  History of present illness:  Brandi Richards is a 68 year old right-handed white female with a history of a benign essential tremor. The patient was placed on propranolol last seen, but she decided to stop the medication. The patient began having episodes of ptosis of the left eye only, only at night when she got up to go the bathroom. The ptosis would last about 20 minutes and then resolve. The patient denies any double vision. She has no headaches, or numbness or weakness on the extremities or face. She denies any speech or swallowing problems or problems with chewing. The patient continues to have the essential tremor. The low-dose propranolol was otherwise tolerated, but she is concerned that it was causing her new symptoms. She denies that there is any mucus in the eye that keeps the eyelid shut. She denies any ptosis of the right eye. She has not had problems whatsoever during the day. She is sent to this office for an evaluation.  Past Medical History  Diagnosis Date  . Thyroid disease   . Hypertension   . Hyperlipidemia   . Movement disorder   . Nocturnal leg cramps   . Restless leg syndrome   . Essential and other specified forms of tremor     Past Surgical History  Procedure Laterality Date  . Cholecystectomy    . Partial hysterectomy    . Shoulder surgery    . Back surgery    . Tubal ligation    . Tonsillectomy    . Nasal sinus surgery      Family History  Problem Relation Age of Onset  . Colon cancer Neg Hx   . Cancer - Lung Father   . Thyroid disease Sister   . Macular degeneration Sister   . Thyroid disease Sister     Social history:  reports that she has never smoked. She has never used smokeless tobacco. She reports that she does not drink alcohol or use illicit drugs.    Allergies  Allergen Reactions  . Primidone     Nausea and vomiting  . Sulfonamide Derivatives     REACTION: rash     Medications:  Current Outpatient Prescriptions on File Prior to Visit  Medication Sig Dispense Refill  . ALPRAZolam (XANAX) 0.25 MG tablet Take 1 tablet (0.25 mg total) by mouth 3 (three) times daily as needed.  270 tablet  1  . aspirin 81 MG tablet Take 81 mg by mouth daily.      . baclofen (LIORESAL) 20 MG tablet Take 2 tablets (40 mg total) by mouth at bedtime.  180 tablet  1  . Calcium Carbonate-Vitamin D (CALCIUM + D PO) Take 600 mg by mouth daily.      . Cholecalciferol (VITAMIN D) 2000 UNITS tablet Take 2,000 Units by mouth daily.      . Cyanocobalamin (VITAMIN B12 PO)       . hydrochlorothiazide (HYDRODIURIL) 25 MG tablet Take 12.5 mg by mouth daily.      Marland Kitchen levothyroxine (SYNTHROID, LEVOTHROID) 112 MCG tablet Take 112 mcg by mouth daily before breakfast.      . lisinopril (PRINIVIL,ZESTRIL) 10 MG tablet Take 5 mg by mouth at bedtime.       . Magnesium 400 MG CAPS Take 1 capsule by mouth 2 (two) times daily.      . Multiple Vitamin (MULTIVITAMIN) tablet Take 1 tablet by mouth daily.      Marland Kitchen  Multiple Vitamins-Minerals (HAIR/SKIN/NAILS PO) Take 2 tablets by mouth 2 (two) times daily.      . pravastatin (PRAVACHOL) 10 MG tablet Take 10 mg by mouth daily.      . ranitidine (ZANTAC) 150 MG tablet Take 150 mg by mouth daily.       No current facility-administered medications on file prior to visit.    ROS:  Out of a complete 14 system review of symptoms, the patient complains only of the following symptoms, and all other reviewed systems are negative.  Muscle cramps Ptosis, left eyelid Tremors  Blood pressure 139/90, pulse 93, weight 156 lb (70.761 kg).  Physical Exam  General: The patient is alert and cooperative at the time of the examination.  Skin: No significant peripheral edema is noted.   Neurologic Exam  Mental status: The patient is oriented x 3.  Cranial nerves: Facial symmetry is not present. There is slight proptosis of the right eye. Speech is normal, no  aphasia or dysarthria is noted. Extraocular movements are full. Visual fields are full.  Motor: The patient has good strength in all 4 extremities.  Sensory examination: Soft touch sensation is symmetric on the face, arms, and legs.  Coordination: The patient has good finger-nose-finger and heel-to-shin bilaterally. The patient has an intention tremor with finger-nose-finger bilaterally.  Gait and station: The patient has a normal gait. Tandem gait is normal. Romberg is negative. No drift is seen.  Reflexes: Deep tendon reflexes are symmetric.   MRI brain 12/29/2005:  IMPRESSION:  Small white matter lesions bilaterally are likely due to chronic infarcts. No acute abnormality.    Assessment/Plan:  1. Reported transient left eye ptosis  2. Benign essential tremor  The patient gives an unusual history of transient ptosis of the left eye only at night when she wakes up to go to the bathroom. She did not note any problems at other times, and there are no other associated symptoms. The patient will be sent for blood work today, and she will have MRA evaluation of the head. She will followup in October for her usual revisit for her tremor. The patient has gone off of the propranolol, but she may return to this medication if she desires to treat the tremor. I do not think this medication has anything to do with her current symptoms.   Jill Alexanders MD 12/13/2013 2:39 PM  [facility] [address] [place], [place] [address]  Phone [phone] Fax [phone]

## 2013-12-13 NOTE — Patient Instructions (Signed)

## 2013-12-13 NOTE — Telephone Encounter (Signed)
Patient calling to request sooner appointment with DR. Willis concerning eyelid problem, states that when she gets up in the middle of the night her eyelid won't open for a few minutes. Patient's PCP suggested she schedule an appointment. Please return call to patient and advise.

## 2013-12-13 NOTE — Telephone Encounter (Signed)
Called patient to offer her  2pm today. Left msg.

## 2013-12-17 DIAGNOSIS — H02409 Unspecified ptosis of unspecified eyelid: Secondary | ICD-10-CM | POA: Insufficient documentation

## 2013-12-17 LAB — ACETYLCHOLINE RECEPTOR, BINDING

## 2013-12-17 NOTE — Progress Notes (Signed)
Quick Note:  Shared results with patient and she verbalized understanding and wanted to know when MRI will be scheduled? Informed patient that once approved thru insurance , we will call to schedule appt.,verbalized understanding ______

## 2013-12-27 ENCOUNTER — Ambulatory Visit
Admission: RE | Admit: 2013-12-27 | Discharge: 2013-12-27 | Disposition: A | Discharge status: Home / Self Care | Payer: Commercial Managed Care - HMO | Source: Ambulatory Visit | Attending: Neurology | Admitting: Neurology

## 2013-12-27 DIAGNOSIS — R252 Cramp and spasm: Secondary | ICD-10-CM

## 2013-12-27 DIAGNOSIS — G250 Essential tremor: Secondary | ICD-10-CM

## 2013-12-27 DIAGNOSIS — H02402 Unspecified ptosis of left eyelid: Secondary | ICD-10-CM

## 2013-12-27 DIAGNOSIS — G252 Other specified forms of tremor: Secondary | ICD-10-CM

## 2013-12-27 DIAGNOSIS — H5310 Unspecified subjective visual disturbances: Secondary | ICD-10-CM

## 2013-12-27 DIAGNOSIS — G2581 Restless legs syndrome: Secondary | ICD-10-CM

## 2013-12-28 ENCOUNTER — Telehealth: Payer: Self-pay | Admitting: Neurology

## 2013-12-28 MED ORDER — PROPRANOLOL HCL 20 MG PO TABS
20.0000 mg | ORAL_TABLET | Freq: Two times a day (BID) | ORAL | Status: DC
Start: 2013-12-28 — End: 2014-05-27

## 2013-12-28 NOTE — Telephone Encounter (Signed)
I called patient. I discussed the results of the MRA test. This was unremarkable. No clear etiology of her ptosis of the eye. She apparently has tried Inderal again, believes that it is helpful for her tremor. She could not tolerate Mysoline.    MRA head 12/28/2013:  Impression   Normal MRA of the brain without significant stenosis of large  to medium size intracranial vessels.

## 2014-03-14 ENCOUNTER — Ambulatory Visit: Payer: Commercial Managed Care - HMO | Admitting: Neurology

## 2014-03-15 ENCOUNTER — Other Ambulatory Visit: Payer: Self-pay

## 2014-05-27 ENCOUNTER — Ambulatory Visit (INDEPENDENT_AMBULATORY_CARE_PROVIDER_SITE_OTHER): Payer: Commercial Managed Care - HMO | Admitting: Neurology

## 2014-05-27 ENCOUNTER — Encounter: Payer: Self-pay | Admitting: Neurology

## 2014-05-27 VITALS — BP 119/72 | HR 64 | Ht 66.0 in | Wt 154.0 lb

## 2014-05-27 DIAGNOSIS — R252 Cramp and spasm: Secondary | ICD-10-CM

## 2014-05-27 DIAGNOSIS — G252 Other specified forms of tremor: Principal | ICD-10-CM

## 2014-05-27 DIAGNOSIS — G250 Essential tremor: Secondary | ICD-10-CM

## 2014-05-27 DIAGNOSIS — R251 Tremor, unspecified: Secondary | ICD-10-CM

## 2014-05-27 DIAGNOSIS — G2581 Restless legs syndrome: Secondary | ICD-10-CM

## 2014-05-27 MED ORDER — PROPRANOLOL HCL 20 MG PO TABS: 20.0000 mg | ORAL_TABLET | Freq: Two times a day (BID) | ORAL | Status: DC

## 2014-05-27 NOTE — Patient Instructions (Signed)

## 2014-05-27 NOTE — Progress Notes (Signed)
Reason for visit: Tremor  Brandi Richards is an 68 y.o. female  History of present illness:  Brandi Richards is a 68 year old right-handed white female with a benign essential tremor and leg cramps. In the past, she was diagnosed with restless leg syndrome, but she appears to have mainly leg cramps, not restless legs. The patient has gained some benefit with baclofen with the leg cramps, and she is on propranolol taking 20 mg twice daily. She indicates that in combination with hydrochlorothiazide, this patient will have some dizziness when she stands up. The patient has had ongoing episodes of ptosis of the left eye only, occurring when she gets up in the morning lasting only a few minutes. Blood work evaluation for myasthenia gravis was negative, MRA of the head was unremarkable. The patient has seen her ophthalmologist, who offered no specific treatment for this issue. The patient otherwise has not had any new medical issues that have come up since last seen. In the past, the patient has been on Mysoline and Topamax for the tremor without benefit. The patient returns this office for an evaluation.    Past Medical History  Diagnosis Date  . Thyroid disease   . Hypertension   . Hyperlipidemia   . Movement disorder   . Nocturnal leg cramps   . Restless leg syndrome   . Essential and other specified forms of tremor     Past Surgical History  Procedure Laterality Date  . Cholecystectomy    . Partial hysterectomy    . Shoulder surgery    . Back surgery    . Tubal ligation    . Tonsillectomy    . Nasal sinus surgery      Family History  Problem Relation Age of Onset  . Colon cancer Neg Hx   . Cancer - Lung Father   . Thyroid disease Sister   . Macular degeneration Sister   . Thyroid disease Sister     Social history:  reports that she has never smoked. She has never used smokeless tobacco. She reports that she does not drink alcohol or use illicit drugs.    Allergies  Allergen  Reactions  . Primidone     Nausea and vomiting  . Sulfonamide Derivatives     REACTION: rash    Medications:  Current Outpatient Prescriptions on File Prior to Visit  Medication Sig Dispense Refill  . ALPRAZolam (XANAX) 0.25 MG tablet Take 1 tablet (0.25 mg total) by mouth 3 (three) times daily as needed. 270 tablet 1  . aspirin 81 MG tablet Take 81 mg by mouth daily.    . baclofen (LIORESAL) 20 MG tablet Take 2 tablets (40 mg total) by mouth at bedtime. 180 tablet 1  . Calcium Carbonate-Vitamin D (CALCIUM + D PO) Take 600 mg by mouth daily.    . Cholecalciferol (VITAMIN D) 2000 UNITS tablet Take 2,000 Units by mouth daily.    . Cyanocobalamin (VITAMIN B12 PO)     . hydrochlorothiazide (HYDRODIURIL) 25 MG tablet Take 12.5 mg by mouth daily.    Marland Kitchen levothyroxine (SYNTHROID, LEVOTHROID) 112 MCG tablet Take 112 mcg by mouth daily before breakfast.    . lisinopril (PRINIVIL,ZESTRIL) 10 MG tablet Take 5 mg by mouth at bedtime.     . Magnesium 400 MG CAPS Take 1 capsule by mouth 2 (two) times daily.    . Multiple Vitamin (MULTIVITAMIN) tablet Take 1 tablet by mouth daily.    . pravastatin (PRAVACHOL) 10 MG tablet Take  10 mg by mouth daily.    . ranitidine (ZANTAC) 150 MG tablet Take 150 mg by mouth daily.     No current facility-administered medications on file prior to visit.    ROS:  Out of a complete 14 system review of symptoms, the patient complains only of the following symptoms, and all other reviewed systems are negative.  Tremor, muscle cramps  Blood pressure 119/72, pulse 64, height 5\' 6"  (1.676 m), weight 154 lb (69.854 kg).  Physical Exam  General: The patient is alert and cooperative at the time of the examination.  Skin: No significant peripheral edema is noted.   Neurologic Exam  Mental status: The patient is oriented x 3.  Cranial nerves: Facial symmetry is present. Speech is normal, no aphasia or dysarthria is noted. Extraocular movements are full. Visual fields  are full. A mild vocal tremor is seen.  Motor: The patient has good strength in all 4 extremities.  Sensory examination: Soft touch sensation is symmetric on the face, arms, or legs.   Coordination: The patient has good finger-nose-finger and heel-to-shin bilaterally.The patient does have an intention tremor with finger-nose-finger bilaterally.  Gait and station: The patient has a normal gait. Tandem gait is normal. Romberg is negative. No drift is seen.  Reflexes: Deep tendon reflexes are symmetric.   Assessment/Plan:  1. Benign essential tremor  2. Nocturnal leg cramps  The patient is doing relatively well currently with the propranolol and the baclofen. The patient indicates that she probably could not tolerate higher doses of propranolol. The patient may take alprazolam if needed when the tremor is more severe. The patient has heightened tremor with anxiety or fatigue.  She will follow-up in 6 months.   Jill Alexanders MD 05/27/2014 7:39 PM  [facility] [address] [place], [place] [address]  Phone [phone] Fax [phone]

## 2014-07-29 ENCOUNTER — Other Ambulatory Visit: Payer: Self-pay | Admitting: Neurology

## 2014-07-29 MED ORDER — ALPRAZOLAM 0.25 MG PO TABS: 0.2500 mg | ORAL_TABLET | Freq: Three times a day (TID) | ORAL | Status: DC | PRN

## 2014-07-29 MED ORDER — BACLOFEN 20 MG PO TABS: 40.0000 mg | ORAL_TABLET | Freq: Every day | ORAL | Status: DC

## 2014-07-29 MED ORDER — PROPRANOLOL HCL 20 MG PO TABS: 20.0000 mg | ORAL_TABLET | Freq: Two times a day (BID) | ORAL | Status: DC

## 2014-07-29 NOTE — Telephone Encounter (Signed)
Pt is calling needing Rx for mail order [employer].  She needs refills on propranolol (INDERAL) 20 MG tablet and ALPRAZolam (XANAX) 0.25 MG tablet and baclofen (LIORESAL) 20 MG tablet. Please call and advise.

## 2014-07-29 NOTE — Telephone Encounter (Signed)
Request entered, forwarded to provider for approval.  

## 2014-09-16 DIAGNOSIS — E039 Hypothyroidism, unspecified: Secondary | ICD-10-CM | POA: Diagnosis not present

## 2014-09-16 DIAGNOSIS — Z0000 Encounter for general adult medical examination without abnormal findings: Secondary | ICD-10-CM | POA: Diagnosis not present

## 2014-09-16 DIAGNOSIS — E780 Pure hypercholesterolemia: Secondary | ICD-10-CM | POA: Diagnosis not present

## 2014-09-16 DIAGNOSIS — I10 Essential (primary) hypertension: Secondary | ICD-10-CM | POA: Diagnosis not present

## 2014-09-16 DIAGNOSIS — Z23 Encounter for immunization: Secondary | ICD-10-CM | POA: Diagnosis not present

## 2014-09-16 DIAGNOSIS — Z1389 Encounter for screening for other disorder: Secondary | ICD-10-CM | POA: Diagnosis not present

## 2014-11-05 ENCOUNTER — Other Ambulatory Visit (HOSPITAL_COMMUNITY): Payer: Self-pay | Admitting: Family Medicine

## 2014-11-05 DIAGNOSIS — Z1231 Encounter for screening mammogram for malignant neoplasm of breast: Secondary | ICD-10-CM

## 2014-11-25 ENCOUNTER — Other Ambulatory Visit: Payer: Self-pay

## 2014-11-26 ENCOUNTER — Encounter: Payer: Self-pay | Admitting: Neurology

## 2014-11-26 ENCOUNTER — Ambulatory Visit (INDEPENDENT_AMBULATORY_CARE_PROVIDER_SITE_OTHER): Payer: Commercial Managed Care - HMO | Admitting: Neurology

## 2014-11-26 VITALS — BP 99/68 | HR 81 | Ht 65.0 in | Wt 157.0 lb

## 2014-11-26 DIAGNOSIS — G250 Essential tremor: Secondary | ICD-10-CM

## 2014-11-26 DIAGNOSIS — R252 Cramp and spasm: Secondary | ICD-10-CM | POA: Diagnosis not present

## 2014-11-26 DIAGNOSIS — G252 Other specified forms of tremor: Secondary | ICD-10-CM

## 2014-11-26 DIAGNOSIS — R251 Tremor, unspecified: Secondary | ICD-10-CM | POA: Diagnosis not present

## 2014-11-26 MED ORDER — GABAPENTIN 100 MG PO CAPS: 100.0000 mg | ORAL_CAPSULE | Freq: Three times a day (TID) | ORAL | Status: DC

## 2014-11-26 NOTE — Patient Instructions (Signed)

## 2014-11-26 NOTE — Progress Notes (Signed)
Reason for visit: Tremor  Brandi Richards is an 69 y.o. female  History of present illness:  Brandi Richards is a 69 year old right-handed white female with a history of a benign essential tremor that affects both upper extremities and she also has a vocal tremor. The patient has been on propranolol, but she went off the medication as it was causing weight gain and she feels that it was not beneficial and made her too fatigued. The patient has not done well on Topamax and Mysoline previously. The patient has some nocturnal leg cramps, but she does well using a topical spray and using baclofen 40 mg at night. She has alprazolam to take off and on during the day, she indicates that this will also help the tremor, but it makes her drowsy as well. She denies any other significant medical issues that have come up since last seen.  Past Medical History  Diagnosis Date  . Thyroid disease   . Hypertension   . Hyperlipidemia   . Movement disorder   . Nocturnal leg cramps   . Restless leg syndrome   . Essential and other specified forms of tremor     Past Surgical History  Procedure Laterality Date  . Cholecystectomy    . Partial hysterectomy    . Shoulder surgery    . Back surgery    . Tubal ligation    . Tonsillectomy    . Nasal sinus surgery      Family History  Problem Relation Age of Onset  . Colon cancer Neg Hx   . Cancer - Lung Father   . Thyroid disease Sister   . Macular degeneration Sister   . Thyroid disease Sister     Social history:  reports that she has never smoked. She has never used smokeless tobacco. She reports that she does not drink alcohol or use illicit drugs.    Allergies  Allergen Reactions  . Primidone     Nausea and vomiting  . Sulfonamide Derivatives     REACTION: rash    Medications:  Prior to Admission medications   Medication Sig Start Date End Date Taking? Authorizing Provider  ALPRAZolam (XANAX) 0.25 MG tablet Take 1 tablet (0.25 mg total)  by mouth 3 (three) times daily as needed. 07/29/14  Yes Kathrynn Ducking, MD  aspirin 81 MG tablet Take 81 mg by mouth daily.   Yes Historical Provider, MD  baclofen (LIORESAL) 20 MG tablet Take 2 tablets (40 mg total) by mouth at bedtime. 07/29/14  Yes Kathrynn Ducking, MD  Calcium Carbonate-Vitamin D (CALCIUM + D PO) Take 600 mg by mouth daily.   Yes Historical Provider, MD  Cholecalciferol (VITAMIN D) 2000 UNITS tablet Take 2,000 Units by mouth daily.   Yes Historical Provider, MD  hydrochlorothiazide (HYDRODIURIL) 25 MG tablet Take 12.5 mg by mouth daily.   Yes Historical Provider, MD  levothyroxine (SYNTHROID, LEVOTHROID) 112 MCG tablet Take 112 mcg by mouth daily before breakfast.   Yes Historical Provider, MD  lisinopril (PRINIVIL,ZESTRIL) 10 MG tablet Take 5 mg by mouth at bedtime.    Yes Historical Provider, MD  Magnesium 400 MG CAPS Take 1 capsule by mouth 2 (two) times daily.   Yes Historical Provider, MD  Multiple Vitamin (MULTIVITAMIN) tablet Take 1 tablet by mouth daily.   Yes Historical Provider, MD  pravastatin (PRAVACHOL) 10 MG tablet Take 10 mg by mouth daily. 09/05/13  Yes Historical Provider, MD  ranitidine (ZANTAC) 150 MG tablet Take  150 mg by mouth daily.   Yes Historical Provider, MD    ROS:  Out of a complete 14 system review of symptoms, the patient complains only of the following symptoms, and all other reviewed systems are negative.  Muscle cramps Tremors  Blood pressure 99/68, pulse 81, height 5\' 5"  (1.651 m), weight 157 lb (71.215 kg).  Physical Exam  General: The patient is alert and cooperative at the time of the examination.  Skin: No significant peripheral edema is noted.   Neurologic Exam  Mental status: The patient is alert and oriented x 3 at the time of the examination. The patient has apparent normal recent and remote memory, with an apparently normal attention span and concentration ability.   Cranial nerves: Facial symmetry is present. Speech is  normal, no aphasia or dysarthria is noted. Extraocular movements are full. Visual fields are full. A mild vocal tremor is noted.  Motor: The patient has good strength in all 4 extremities.  Sensory examination: Soft touch sensation is symmetric on the face, arms, and legs.  Coordination: The patient has good finger-nose-finger and heel-to-shin bilaterally. The patient has an intention tremor with finger-nose-finger bilaterally.  Gait and station: The patient has a normal gait. Tandem gait is normal. Romberg is negative. No drift is seen.  Reflexes: Deep tendon reflexes are symmetric.   Assessment/Plan:  1. Essential tremor  2. Nocturnal leg cramps  The patient is doing well with the leg cramps, she will have only an occasional night with cramps. The patient is still having issues with the tremor. She has not done well on several medications, we will try low-dose gabapentin at this time taking 100 mg 3 times daily. She will follow-up in 6 months, or sooner if needed.  Jill Alexanders MD 11/26/2014 3:39 PM  [facility] [address] [place], [place] [address]  Phone [phone] Fax [phone]

## 2014-12-17 ENCOUNTER — Ambulatory Visit (HOSPITAL_COMMUNITY)
Admission: RE | Admit: 2014-12-17 | Discharge: 2014-12-17 | Disposition: A | Discharge status: Home / Self Care | Payer: Commercial Managed Care - HMO | Source: Ambulatory Visit | Attending: Family Medicine | Admitting: Family Medicine

## 2014-12-17 DIAGNOSIS — Z1231 Encounter for screening mammogram for malignant neoplasm of breast: Secondary | ICD-10-CM | POA: Diagnosis not present

## 2014-12-30 ENCOUNTER — Telehealth: Payer: Self-pay | Admitting: Neurology

## 2014-12-30 MED ORDER — GABAPENTIN 100 MG PO CAPS: 200.0000 mg | ORAL_CAPSULE | Freq: Three times a day (TID) | ORAL | Status: DC

## 2014-12-30 NOTE — Telephone Encounter (Signed)
Pt called requesting increase in medication

## 2014-12-30 NOTE — Telephone Encounter (Signed)
I called the patient. She stated that Dr. Jannifer Franklin told her if the low-dose Gabapentin helped with her tremors, the dose could be increased. She feels like it has helped some and wanted to know if it was okay to increase her dose now?

## 2014-12-30 NOTE — Telephone Encounter (Signed)
I called patient. She is tolerating the gabapentin, it seems to help, we will go to a 200 mg 3 times a day dosing.

## 2015-01-13 ENCOUNTER — Encounter: Payer: Self-pay | Admitting: Gastroenterology

## 2015-01-26 ENCOUNTER — Other Ambulatory Visit: Payer: Self-pay | Admitting: Neurology

## 2015-01-26 ENCOUNTER — Encounter: Payer: Self-pay | Admitting: Neurology

## 2015-01-27 ENCOUNTER — Other Ambulatory Visit: Payer: Self-pay

## 2015-01-27 ENCOUNTER — Encounter: Payer: Self-pay | Admitting: Neurology

## 2015-01-27 ENCOUNTER — Other Ambulatory Visit: Payer: Self-pay | Admitting: Neurology

## 2015-01-27 MED ORDER — ALPRAZOLAM 0.25 MG PO TABS: 0.2500 mg | ORAL_TABLET | Freq: Three times a day (TID) | ORAL | Status: AC | PRN

## 2015-01-27 MED ORDER — BACLOFEN 20 MG PO TABS: 40.0000 mg | ORAL_TABLET | Freq: Every day | ORAL | Status: DC

## 2015-03-25 DIAGNOSIS — Z23 Encounter for immunization: Secondary | ICD-10-CM | POA: Diagnosis not present

## 2015-04-03 DIAGNOSIS — I10 Essential (primary) hypertension: Secondary | ICD-10-CM | POA: Diagnosis not present

## 2015-04-03 DIAGNOSIS — E7800 Pure hypercholesterolemia, unspecified: Secondary | ICD-10-CM | POA: Diagnosis not present

## 2015-04-03 DIAGNOSIS — E039 Hypothyroidism, unspecified: Secondary | ICD-10-CM | POA: Diagnosis not present

## 2015-05-12 ENCOUNTER — Other Ambulatory Visit: Payer: Self-pay | Admitting: Neurology

## 2015-06-05 ENCOUNTER — Encounter: Payer: Self-pay | Admitting: Neurology

## 2015-06-05 ENCOUNTER — Ambulatory Visit (INDEPENDENT_AMBULATORY_CARE_PROVIDER_SITE_OTHER): Payer: Commercial Managed Care - HMO | Admitting: Neurology

## 2015-06-05 VITALS — BP 108/66 | HR 84 | Resp 16 | Ht 65.0 in | Wt 157.0 lb

## 2015-06-05 DIAGNOSIS — R252 Cramp and spasm: Secondary | ICD-10-CM | POA: Diagnosis not present

## 2015-06-05 DIAGNOSIS — G2581 Restless legs syndrome: Secondary | ICD-10-CM

## 2015-06-05 DIAGNOSIS — G250 Essential tremor: Secondary | ICD-10-CM

## 2015-06-05 MED ORDER — BACLOFEN 20 MG PO TABS: 40.0000 mg | ORAL_TABLET | Freq: Every day | ORAL | Status: DC

## 2015-06-05 NOTE — Patient Instructions (Signed)
Tremor °A tremor is trembling or shaking that you cannot control. Most tremors affect the hands or arms. Tremors can also affect the head, vocal cords, face, and other parts of the body. There are many types of tremors. Common types include:  °· Essential tremor. These usually occur in people over the age of 40. It may run in families and can happen in otherwise healthy people.   °· Resting tremor. These occur when the muscles are at rest, such as when your hands are resting in your lap. People with Parkinson disease often have resting tremors.   °· Postural tremor. These occur when you try to hold a pose, such as keeping your hands outstretched.   °· Kinetic tremor. These occur during purposeful movement, such as trying to touch a finger to your nose.   °· Task-specific tremor. These may occur when you perform tasks such as handwriting, speaking, or standing.   °· Psychogenic tremor. These dramatically lessen or disappear when you are distracted. They can happen in people of all ages.   °Some types of tremors have no known cause. Tremors can also be a symptom of nervous system problems (neurological disorders) that may occur with aging. Some tremors go away with treatment while others do not.  °HOME CARE INSTRUCTIONS °Watch your tremor for any changes. The following actions may help to lessen any discomfort you are feeling: °· Take medicines only as directed by your health care provider.   °· Limit alcohol intake to no more than 1 drink per day for nonpregnant women and 2 drinks per day for men. One drink equals 12 oz of beer, 5 oz of wine, or 1½ oz of hard liquor. °· Do not use any tobacco products, including cigarettes, chewing tobacco, or electronic cigarettes. If you need help quitting, ask your health care provider.   °· Avoid extreme heat or cold.    °· Limit the amount of caffeine you consume as directed by your health care provider.   °· Try to get 8 hours of sleep each night. °· Find ways to manage your  stress, such as meditation or yoga. °· Keep all follow-up visits as directed by your health care provider. This is important. °SEEK MEDICAL CARE IF: °· You start having a tremor after starting a new medicine. °· You have tremor with other symptoms such as: °¨ Numbness. °¨ Tingling. °¨ Pain. °¨ Weakness. °· Your tremor gets worse. °· Your tremor interferes with your day-to-day life. °  °This information is not intended to replace advice given to you by your health care provider. Make sure you discuss any questions you have with your health care provider. °  °Document Released: 05/07/2002 Document Revised: 06/07/2014 Document Reviewed: 11/12/2013 °Elsevier Interactive Patient Education ©2016 [employer] ° °

## 2015-06-05 NOTE — Progress Notes (Signed)
Reason for visit: Tremor  Brandi Richards is an 70 y.o. female  History of present illness:  Brandi Richards is a 70 year old right-handed white female with a history of a tremor consistent with an essential tremor. The patient has a vocal component to the tremor as well as the tremor affecting the upper extremities. The patient has had intolerance to multiple medications including Topamax, Mysoline, propranolol, and gabapentin. The gabapentin initially seemed to help, but when she went to 200 mg 3 times a day she had difficulty tolerating the drug. She has gone off of these medications, she occasionally uses Xanax for the tremor. The patient also has nocturnal leg cramps mainly affecting the feet and toes. She takes baclofen 40 mg at night which seems to help, and she is also on magnesium supplementation. The patient has had increased cramps over the last 2 weeks, she is on a diuretic and she takes a statin drug. She returns to the office today for an evaluation. No other new medical issues have come up since last seen.  Past Medical History  Diagnosis Date  . Thyroid disease   . Hypertension   . Hyperlipidemia   . Movement disorder   . Nocturnal leg cramps   . Restless leg syndrome   . Essential and other specified forms of tremor     Past Surgical History  Procedure Laterality Date  . Cholecystectomy    . Partial hysterectomy    . Shoulder surgery    . Back surgery    . Tubal ligation    . Tonsillectomy    . Nasal sinus surgery      Family History  Problem Relation Age of Onset  . Colon cancer Neg Hx   . Cancer - Lung Father   . Thyroid disease Sister   . Macular degeneration Sister   . Thyroid disease Sister     Social history:  reports that she has never smoked. She has never used smokeless tobacco. She reports that she does not drink alcohol or use illicit drugs.    Allergies  Allergen Reactions  . Primidone     Nausea and vomiting  . Sulfonamide Derivatives    REACTION: rash    Medications:  Prior to Admission medications   Medication Sig Start Date End Date Taking? Authorizing Provider  ALPRAZolam (XANAX) 0.25 MG tablet Take 1 tablet (0.25 mg total) by mouth 3 (three) times daily as needed. 01/27/15   Kathrynn Ducking, MD  aspirin 81 MG tablet Take 81 mg by mouth daily.    Historical Provider, MD  baclofen (LIORESAL) 20 MG tablet Take 2 tablets (40 mg total) by mouth at bedtime. 01/27/15   Kathrynn Ducking, MD  Calcium Carbonate-Vitamin D (CALCIUM + D PO) Take 600 mg by mouth daily.    Historical Provider, MD  Cholecalciferol (VITAMIN D) 2000 UNITS tablet Take 2,000 Units by mouth daily.    Historical Provider, MD  gabapentin (NEURONTIN) 100 MG capsule TAKE TWO CAPSULES BY MOUTH THREE TIMES DAILY 05/13/15   Kathrynn Ducking, MD  hydrochlorothiazide (HYDRODIURIL) 25 MG tablet Take 12.5 mg by mouth daily.    Historical Provider, MD  levothyroxine (SYNTHROID, LEVOTHROID) 112 MCG tablet Take 112 mcg by mouth daily before breakfast.    Historical Provider, MD  lisinopril (PRINIVIL,ZESTRIL) 10 MG tablet Take 5 mg by mouth at bedtime.     Historical Provider, MD  Magnesium 400 MG CAPS Take 1 capsule by mouth 2 (two) times daily.  Historical Provider, MD  Multiple Vitamin (MULTIVITAMIN) tablet Take 1 tablet by mouth daily.    Historical Provider, MD  pravastatin (PRAVACHOL) 10 MG tablet Take 10 mg by mouth daily. 09/05/13   Historical Provider, MD  ranitidine (ZANTAC) 150 MG tablet Take 150 mg by mouth daily.    Historical Provider, MD    ROS:  Out of a complete 14 system review of symptoms, the patient complains only of the following symptoms, and all other reviewed systems are negative.  Tremors Muscle cramps  Blood pressure 108/66, pulse 84, resp. rate 16, height 5\' 5"  (1.651 m), weight 157 lb (71.215 kg).  Physical Exam  General: The patient is alert and cooperative at the time of the examination.  Skin: No significant peripheral edema is  noted.   Neurologic Exam  Mental status: The patient is alert and oriented x 3 at the time of the examination. The patient has apparent normal recent and remote memory, with an apparently normal attention span and concentration ability.   Cranial nerves: Facial symmetry is present. Speech is normal, no aphasia or dysarthria is noted. Extraocular movements are full. Visual fields are full. A vocal tremor is noted.  Motor: The patient has good strength in all 4 extremities.  Sensory examination: Soft touch sensation is symmetric on the face, arms, and legs.  Coordination: The patient has good finger-nose-finger and heel-to-shin bilaterally. An intention tremor seen with finger-nose-finger bilaterally.  Gait and station: The patient has a normal gait. Tandem gait is normal. Romberg is negative. No drift is seen.  Reflexes: Deep tendon reflexes are symmetric.   Assessment/Plan:  1. Essential tremor  2. Nocturnal leg cramps  The patient is doing well at this time with the cramps, occasionally the cramps may worsen. The patient will continue the baclofen, a prescription was called in. The patient does not wish any further therapy for the tremor. I have discussed the possibility of a deep brain stimulator as an option. At this point, the patient follow-up in one year, sooner if needed.  Brandi Alexanders MD 06/05/2015 8:10 AM  [facility] [address] [place], [place] [address]  Phone [phone] Fax [phone]

## 2015-09-11 ENCOUNTER — Other Ambulatory Visit: Payer: Self-pay

## 2015-09-11 NOTE — Patient Outreach (Signed)
[facility]) Care Management  09/11/2015  ALATHEA TURNBAUGH July 10, 1945 [id number]   SUBJECTIVE; Telephone call to patient regarding self referral / [employer] direct.  HIPAA verified with patient. Discussed and offered [facility] care management services to patient. Patient states she heard from friends that they had services with [facility] care management and a nurse came out to see them periodically to check their blood pressures and blood sugars. Patient states, "I have high blood pressure, high cholesterol, and thyroid problems."  Patient states she is currently helping her husband work on information for Financial risk analyst and request call back at another time.   PLAN:   RNCM will attempt 2nd telephone call to patient within  1 week.  Quinn Plowman RN,BSN,CCM [facility] Telephonic  [phone]

## 2015-09-16 ENCOUNTER — Ambulatory Visit: Payer: Self-pay

## 2015-09-17 ENCOUNTER — Other Ambulatory Visit: Payer: Self-pay

## 2015-09-17 NOTE — Patient Outreach (Addendum)
[facility]) Care Management  09/17/2015  Brandi Richards 11-10-1945 [id number]  Telephone call to patient regarding [employer] direct/ self referral.  HIPAA verified with patient. Discussed and offered [facility] care management services to patient. Patient declined services. Patient states she thought the program was just having a nurse come out periodically to take blood pressure.   Patient denies needing any additional services or further education regarding health conditions.   PLAN;  RNCM will refer patient to Josepha Pigg to close due to refusal of services.  RNCM will notify patients primary MD of refusal of services.  RNCM will send patient [facility] care management outreach letter and brochure.   Quinn Plowman RN,BSN,CCM [facility] Telephonic  [phone]

## 2015-09-18 ENCOUNTER — Encounter: Payer: Self-pay | Admitting: Gastroenterology

## 2015-09-18 ENCOUNTER — Telehealth: Payer: Self-pay | Admitting: Gastroenterology

## 2015-09-18 DIAGNOSIS — R251 Tremor, unspecified: Secondary | ICD-10-CM | POA: Diagnosis not present

## 2015-09-18 DIAGNOSIS — E7800 Pure hypercholesterolemia, unspecified: Secondary | ICD-10-CM | POA: Diagnosis not present

## 2015-09-18 DIAGNOSIS — E039 Hypothyroidism, unspecified: Secondary | ICD-10-CM | POA: Diagnosis not present

## 2015-09-18 DIAGNOSIS — I10 Essential (primary) hypertension: Secondary | ICD-10-CM | POA: Diagnosis not present

## 2015-09-18 DIAGNOSIS — Z1389 Encounter for screening for other disorder: Secondary | ICD-10-CM | POA: Diagnosis not present

## 2015-09-18 DIAGNOSIS — Z0000 Encounter for general adult medical examination without abnormal findings: Secondary | ICD-10-CM | POA: Diagnosis not present

## 2015-09-18 DIAGNOSIS — Z13820 Encounter for screening for osteoporosis: Secondary | ICD-10-CM | POA: Diagnosis not present

## 2015-10-23 ENCOUNTER — Telehealth: Payer: Self-pay

## 2015-10-23 NOTE — Telephone Encounter (Signed)
2014 colonoscopy with RK noted a FAIR prep with SUPREP.  Dr Loletha Carrow would you like a 2 day prep?  Thank you, Brandi Richards/PV

## 2015-10-23 NOTE — Telephone Encounter (Signed)
Yes, sounds good, thanks

## 2015-10-30 ENCOUNTER — Ambulatory Visit (AMBULATORY_SURGERY_CENTER): Payer: Self-pay

## 2015-10-30 VITALS — Ht 65.0 in | Wt 159.4 lb

## 2015-10-30 DIAGNOSIS — Z860100 Personal history of colon polyps, unspecified: Secondary | ICD-10-CM

## 2015-10-30 MED ORDER — SUPREP BOWEL PREP KIT 17.5-3.13-1.6 GM/177ML PO SOLN: 1.0000 | Freq: Once | ORAL | Status: DC

## 2015-10-30 NOTE — Progress Notes (Signed)
No allergies to eggs or soy nohome oxygen No diet meds No past problems with anesthesia  Has email and internet; declined emmi

## 2015-10-31 ENCOUNTER — Encounter: Payer: Self-pay | Admitting: Gastroenterology

## 2015-11-13 ENCOUNTER — Encounter: Payer: Self-pay | Admitting: Gastroenterology

## 2015-11-13 ENCOUNTER — Ambulatory Visit (AMBULATORY_SURGERY_CENTER): Payer: Commercial Managed Care - HMO | Admitting: Gastroenterology

## 2015-11-13 VITALS — BP 103/60 | HR 65 | Temp 98.2°F | Resp 10 | Ht 65.0 in | Wt 159.0 lb

## 2015-11-13 DIAGNOSIS — D123 Benign neoplasm of transverse colon: Secondary | ICD-10-CM

## 2015-11-13 DIAGNOSIS — Z1211 Encounter for screening for malignant neoplasm of colon: Secondary | ICD-10-CM | POA: Diagnosis not present

## 2015-11-13 DIAGNOSIS — Z86010 Personal history of colonic polyps: Secondary | ICD-10-CM

## 2015-11-13 DIAGNOSIS — D120 Benign neoplasm of cecum: Secondary | ICD-10-CM

## 2015-11-13 DIAGNOSIS — I10 Essential (primary) hypertension: Secondary | ICD-10-CM | POA: Diagnosis not present

## 2015-11-13 DIAGNOSIS — D128 Benign neoplasm of rectum: Secondary | ICD-10-CM | POA: Diagnosis not present

## 2015-11-13 DIAGNOSIS — D122 Benign neoplasm of ascending colon: Secondary | ICD-10-CM | POA: Diagnosis not present

## 2015-11-13 MED ORDER — SODIUM CHLORIDE 0.9 % IV SOLN: 500.0000 mL | INTRAVENOUS | Status: DC

## 2015-11-13 NOTE — Progress Notes (Signed)
Dr. Loletha Carrow in too see patient. Complaint of right upper quadrant pain. Assess and spoke with patient ok to discharge home. Stated i feel better.

## 2015-11-13 NOTE — Progress Notes (Signed)
Called to room to assist during endoscopic procedure.  Patient ID and intended procedure confirmed with present staff. Received instructions for my participation in the procedure from the performing physician.  

## 2015-11-13 NOTE — Patient Instructions (Signed)
Discharge instructions given. Handouts on polyps and hemorrhoids. No aspirin,ibuprofen,naproxen,or other non-steroidal anti-inflammatory drugs for 7 days. Resume previous medications. YOU HAD AN ENDOSCOPIC PROCEDURE TODAY AT [facility]:   Refer to the procedure report that was given to you for any specific questions about what was found during the examination.  If the procedure report does not answer your questions, please call your gastroenterologist to clarify.  If you requested that your care partner not be given the details of your procedure findings, then the procedure report has been included in a sealed envelope for you to review at your convenience later.  YOU SHOULD EXPECT: Some feelings of bloating in the abdomen. Passage of more gas than usual.  Walking can help get rid of the air that was put into your GI tract during the procedure and reduce the bloating. If you had a lower endoscopy (such as a colonoscopy or flexible sigmoidoscopy) you may notice spotting of blood in your stool or on the toilet paper. If you underwent a bowel prep for your procedure, you may not have a normal bowel movement for a few days.  Please Note:  You might notice some irritation and congestion in your nose or some drainage.  This is from the oxygen used during your procedure.  There is no need for concern and it should clear up in a day or so.  SYMPTOMS TO REPORT IMMEDIATELY:   Following lower endoscopy (colonoscopy or flexible sigmoidoscopy):  Excessive amounts of blood in the stool  Significant tenderness or worsening of abdominal pains  Swelling of the abdomen that is new, acute  Fever of 100F or higher  For urgent or emergent issues, a gastroenterologist can be reached at any hour by calling [phone].   DIET: Your first meal following the procedure should be a small meal and then it is ok to progress to your normal diet. Heavy or fried foods are harder to digest and may make  you feel nauseous or bloated.  Likewise, meals heavy in dairy and vegetables can increase bloating.  Drink plenty of fluids but you should avoid alcoholic beverages for 24 hours.  ACTIVITY:  You should plan to take it easy for the rest of today and you should NOT DRIVE or use heavy machinery until tomorrow (because of the sedation medicines used during the test).    FOLLOW UP: Our staff will call the number listed on your records the next business day following your procedure to check on you and address any questions or concerns that you may have regarding the information given to you following your procedure. If we do not reach you, we will leave a message.  However, if you are feeling well and you are not experiencing any problems, there is no need to return our call.  We will assume that you have returned to your regular daily activities without incident.  If any biopsies were taken you will be contacted by phone or by letter within the next 1-3 weeks.  Please call us at [phone] if you have not heard about the biopsies in 3 weeks.    SIGNATURES/CONFIDENTIALITY: You and/or your care partner have signed paperwork which will be entered into your electronic medical record.  These signatures attest to the fact that that the information above on your After Visit Summary has been reviewed and is understood.  Full responsibility of the confidentiality of this discharge information lies with you and/or your care-partner.

## 2015-11-13 NOTE — Progress Notes (Signed)
Patient awakening,vss,report to rn 

## 2015-11-13 NOTE — Op Note (Signed)
[place] Patient Name: Brandi Richards Procedure Date: 11/13/2015 8:38 AM MRN: [id number] Endoscopist: Mallie Mussel L. Loletha Carrow , MD Age: 70 Referring MD:  Date of Birth: 03-12-1946 Gender: Female Procedure:                Colonoscopy Indications:              Surveillance: Personal history of adenomatous                            polyps on last colonoscopy > 3 years ago Medicines:                Monitored Anesthesia Care Procedure:                Pre-Anesthesia Assessment:                           - Prior to the procedure, a History and Physical                            was performed, and patient medications and                            allergies were reviewed. The patient's tolerance of                            previous anesthesia was also reviewed. The risks                            and benefits of the procedure and the sedation                            options and risks were discussed with the patient.                            All questions were answered, and informed consent                            was obtained. Prior Anticoagulants: The patient has                            taken aspirin, last dose was 1 day prior to                            procedure. ASA Grade Assessment: II - A patient                            with mild systemic disease. After reviewing the                            risks and benefits, the patient was deemed in                            satisfactory condition to undergo the procedure.  After obtaining informed consent, the colonoscope                            was passed under direct vision. Throughout the                            procedure, the patient's blood pressure, pulse, and                            oxygen saturations were monitored continuously. The                            Model CF-HQ190L [id number]) scope was introduced                            through the anus and advanced to the the  cecum,                            identified by appendiceal orifice and ileocecal                            valve. The colonoscopy was technically difficult                            and complex due to a tortuous colon. The patient                            tolerated the procedure well. The quality of the                            bowel preparation was fair. The ileocecal valve and                            the cecal strap were photographed. The bowel                            preparation used was Miralax. Scope In: 8:43:00 AM Scope Out: 9:12:10 AM Scope Withdrawal Time: 0 hours 19 minutes 21 seconds  Total Procedure Duration: 0 hours 29 minutes 10 seconds  Findings:                 The digital rectal exam findings include decreased                            sphincter tone.                           Six sessile polyps were found in the proximal                            transverse colon (1), proximal ascending colon (2),                            distal ascending colon(1) and cecum(2). The polyps  were 2 to 8 mm in size. These polyps were removed                            with a cold snare. Resection and retrieval were                            complete.                           A 8 mm polyp was found in the rectum. The polyp was                            sessile. The polyp was removed with a hot snare.                            Resection and retrieval were complete.                           Internal hemorrhoids were found during                            retroflexion. The hemorrhoids were Grade I                            (internal hemorrhoids that do not prolapse).                           The exam was otherwise without abnormality. Complications:            No immediate complications. Estimated Blood Loss:     Estimated blood loss was minimal. Impression:               - Preparation of the colon was fair.                           -  Decreased sphincter tone found on digital rectal                            exam.                           - Six 2 to 8 mm polyps in the proximal transverse                            colon (1), in the proximal ascending colon(2), in                            the distal ascending colon(1) and in the cecum(2),                            removed with a cold snare. Resected and retrieved.                           - One 8 mm polyp in the rectum, removed with a  hot                            snare. Resected and retrieved.                           - Internal hemorrhoids.                           - The examination was otherwise normal. Recommendation:           - Patient has a contact number available for                            emergencies. The signs and symptoms of potential                            delayed complications were discussed with the                            patient. Return to normal activities tomorrow.                            Written discharge instructions were provided to the                            patient.                           - Resume previous diet.                           - No aspirin, ibuprofen, naproxen, or other                            non-steroidal anti-inflammatory drugs for 7 days                            after polyp removal.                           - Await pathology results.                           - Repeat colonoscopy with a more extensive bowel                            preparation in 2 years for surveillance. Kolbey Teichert L. Loletha Carrow, MD 11/13/2015 9:19:41 AM This report has been signed electronically.

## 2015-11-14 ENCOUNTER — Telehealth: Payer: Self-pay

## 2015-11-14 NOTE — Telephone Encounter (Signed)
  Follow up Call-  Call back number 11/13/2015  Post procedure Call Back phone  # [phone]  Permission to leave phone message Yes     Patient questions:  Do you have a fever, pain , or abdominal swelling? No. Pain Score  0 *  Have you tolerated food without any problems? Yes.    Have you been able to return to your normal activities? yes  Do you have any questions about your discharge instructions: Diet   No. Medications  No. Follow up visit  No.  Do you have questions or concerns about your Care? No.  Actions: * If pain score is 4 or above: No action needed, pain <4.

## 2015-11-19 ENCOUNTER — Encounter: Payer: Self-pay | Admitting: Gastroenterology

## 2015-11-20 ENCOUNTER — Telehealth: Payer: Self-pay | Admitting: Gastroenterology

## 2015-11-20 NOTE — Telephone Encounter (Signed)
The patient has been notified of this information and all questions answered.

## 2015-11-20 NOTE — Telephone Encounter (Signed)
Dear Ms. Kleiner,  The polyps removed from your colon were precancerous. This means that they had the potential to change into cancer over time. The rectal polyp had a pre-cancerous change called high grade dysplasia.  All of the polyps were completely removed during the procedure.  I recommend you have a repeat colonoscopy in 1 year to determine if you have developed any new polyps and to screen for colorectal cancer.  If you develop any new rectal bleeding, abdominal pain or significant bowel habit changes, please contact us before then at Dept: [phone].  Please call us if you have persistent problems or have questions about your condition that have not been fully answered at this time.   Sincerely,   Doran Stabler, MD

## 2015-11-26 ENCOUNTER — Other Ambulatory Visit: Payer: Self-pay | Admitting: Family Medicine

## 2015-11-26 DIAGNOSIS — Z1231 Encounter for screening mammogram for malignant neoplasm of breast: Secondary | ICD-10-CM

## 2015-12-18 ENCOUNTER — Encounter: Payer: Self-pay | Admitting: Gastroenterology

## 2015-12-18 ENCOUNTER — Ambulatory Visit (INDEPENDENT_AMBULATORY_CARE_PROVIDER_SITE_OTHER): Payer: Commercial Managed Care - HMO | Admitting: Gastroenterology

## 2015-12-18 VITALS — BP 100/60 | HR 88 | Ht 65.0 in | Wt 163.8 lb

## 2015-12-18 DIAGNOSIS — R140 Abdominal distension (gaseous): Secondary | ICD-10-CM | POA: Diagnosis not present

## 2015-12-18 DIAGNOSIS — K5901 Slow transit constipation: Secondary | ICD-10-CM | POA: Diagnosis not present

## 2015-12-18 DIAGNOSIS — K635 Polyp of colon: Secondary | ICD-10-CM | POA: Diagnosis not present

## 2015-12-18 NOTE — Patient Instructions (Addendum)
If you are age 71 or older, your body mass index should be between 23-30. Your Body mass index is 27.26 kg/(m^2). If this is out of the aforementioned range listed, please consider follow up with your Primary Care Provider.  If you are age 10 or younger, your body mass index should be between 19-25. Your Body mass index is 27.26 kg/(m^2). If this is out of the aformentioned range listed, please consider follow up with your Primary Care Provider.   Thank you for choosing [facility]  Dr Wilfrid Lund III    Food Guidelines for a sensitive stomach  Many people have difficulty digesting certain foods, causing a variety of distressing and embarrassing symptoms such as abdominal pain, bloating and gas.  These foods may need to be avoided or consumed in small amounts.  Here are some tips that might be helpful for you.  1.   Lactose intolerance is the difficulty or complete inability to digest lactose, the natural sugar in milk and anything made from milk.  This condition is harmless, common, and can begin any time during life.  Some people can digest a modest amount of lactose while others cannot tolerate any.  Also, not all dairy products contain equal amounts of lactose.  For example, hard cheeses such as parmesan have less lactose than soft cheeses such as cheddar.  Yogurt has less lactose than milk or cheese.  Many packaged foods (even many brands of bread) have milk, so read ingredient lists carefully.  It is difficult to test for lactose intolerance, so just try avoiding lactose as much as possible for a week and see what happens with your symptoms.  If you seem to be lactose intolerant, the best plan is to avoid it (but make sure you get calcium from another source).  The next best thing is to use lactase enzyme supplements, available over the counter everywhere.  Just know that many lactose intolerant people need to take several tablets with each serving of dairy to avoid symptoms.  Lastly, a lot of  restaurant food is made with milk or butter.  Many are things you might not suspect, such as mashed potatoes, rice and pasta (cooked with butter) and "grilled" items.  If you are lactose intolerant, it never hurts to ask your server what has milk or butter.  2.   Fiber is an important part of your diet, but not all fiber is well-tolerated.  Insoluble fiber such as bran is often consumed by normal gut bacteria and converted into gas.  Soluble fiber such as oats, squash, carrots and green beans are typically tolerated better.  3.   Some types of carbohydrates can be poorly digested.  Examples include: fructose (apples, cherries, pears, raisins and other dried fruits), fructans (onions, zucchini, large amounts of wheat), sorbitol/mannitol/xylitol and sucralose/Splenda (common artificial sweeteners), and raffinose (lentils, broccoli, cabbage, asparagus, brussel sprouts, many types of beans).  Do a Development worker, community for [employer] and you will find helpful information. Beano, a dietary supplement, will often help with raffinose-containing foods.  As with lactase tablets, you may need several per serving.  4.   Whenever possible, avoid processed food&meats and chemical additives.  High fructose corn syrup, a common sweetener, may be difficult to digest.  Eggs and soy (comes from the soybean, and added to many foods now) are the other most common bloating/gassy foods.  - Dr. [facility]

## 2015-12-18 NOTE — Progress Notes (Signed)
[facility] Progress Note  Chief Complaint: Constipation, dysplastic colon polyp  Subjective History:  Brandi Richards saw me for surveillance colonoscopy last month. 6 tubular adenomas were removed, and an additional tubular adenoma with high-grade dysplasia was removed from the rectum with hot snare. Meeting her that day, she described chronic constipation and bloating. Therefore I set her up for an office visit. She has been taking MiraLAX one or 2 times daily at my suggestion, and things have improved. She tends toward bloating with certain types of fiber , especially whole wheat.  ROS: Cardiovascular:  no chest pain Respiratory: no dyspnea  The patient's Past Medical, Family and Social History were reviewed and are on file in the EMR.  Objective:  Med list reviewed  Vital signs in last 24 hrs: Filed Vitals:   12/18/15 1051  BP: 100/60  Pulse: 88   The entire 20 minute encounter was spent in counseling and coordination of care.   Topics discussed: Treatment of chronic constipation, dietary advice, and management of dysplastic colon polyp.   Recent Labs:  Path: TA x 6, and rectal TA with HGD  Radiologic studies:  See full colonoscopy report  @ASSESSMENTPLANBEGIN @ Assessment: Encounter Diagnoses  Name Primary?  . Slow transit constipation Yes  . Abdominal bloating   . Colon polyp    She has slow transit constipation exacerbated by tortuous colon anatomy. She had a fair preparation despite a 2 day prep with MiraLAX and Xu prep. See advice given above.   Plan: Repeat colonoscopy with a 2 day MoviPrep/Xu prep in one year because of dysplastic colon polyp.  Nelida Meuse III

## 2015-12-23 ENCOUNTER — Ambulatory Visit
Admission: RE | Admit: 2015-12-23 | Discharge: 2015-12-23 | Disposition: A | Discharge status: Home / Self Care | Payer: Commercial Managed Care - HMO | Source: Ambulatory Visit | Attending: Family Medicine | Admitting: Family Medicine

## 2015-12-23 DIAGNOSIS — Z1231 Encounter for screening mammogram for malignant neoplasm of breast: Secondary | ICD-10-CM | POA: Diagnosis not present

## 2015-12-30 DIAGNOSIS — M8588 Other specified disorders of bone density and structure, other site: Secondary | ICD-10-CM | POA: Diagnosis not present

## 2016-01-22 DIAGNOSIS — H43813 Vitreous degeneration, bilateral: Secondary | ICD-10-CM | POA: Diagnosis not present

## 2016-01-22 DIAGNOSIS — Z961 Presence of intraocular lens: Secondary | ICD-10-CM | POA: Diagnosis not present

## 2016-01-22 DIAGNOSIS — H04123 Dry eye syndrome of bilateral lacrimal glands: Secondary | ICD-10-CM | POA: Diagnosis not present

## 2016-01-22 DIAGNOSIS — H26493 Other secondary cataract, bilateral: Secondary | ICD-10-CM | POA: Diagnosis not present

## 2016-01-29 DIAGNOSIS — H26493 Other secondary cataract, bilateral: Secondary | ICD-10-CM | POA: Diagnosis not present

## 2016-03-23 DIAGNOSIS — E7800 Pure hypercholesterolemia, unspecified: Secondary | ICD-10-CM | POA: Diagnosis not present

## 2016-03-23 DIAGNOSIS — I10 Essential (primary) hypertension: Secondary | ICD-10-CM | POA: Diagnosis not present

## 2016-03-23 DIAGNOSIS — E039 Hypothyroidism, unspecified: Secondary | ICD-10-CM | POA: Diagnosis not present

## 2016-03-23 DIAGNOSIS — Z23 Encounter for immunization: Secondary | ICD-10-CM | POA: Diagnosis not present

## 2016-06-08 ENCOUNTER — Ambulatory Visit: Payer: Commercial Managed Care - HMO | Admitting: Adult Health

## 2016-06-22 DIAGNOSIS — R300 Dysuria: Secondary | ICD-10-CM | POA: Diagnosis not present

## 2016-06-22 DIAGNOSIS — N898 Other specified noninflammatory disorders of vagina: Secondary | ICD-10-CM | POA: Diagnosis not present

## 2016-09-14 ENCOUNTER — Encounter: Payer: Self-pay | Admitting: Gastroenterology

## 2016-09-16 ENCOUNTER — Encounter: Payer: Self-pay | Admitting: Gastroenterology

## 2016-09-23 DIAGNOSIS — E7800 Pure hypercholesterolemia, unspecified: Secondary | ICD-10-CM | POA: Diagnosis not present

## 2016-09-23 DIAGNOSIS — Z0000 Encounter for general adult medical examination without abnormal findings: Secondary | ICD-10-CM | POA: Diagnosis not present

## 2016-09-23 DIAGNOSIS — I10 Essential (primary) hypertension: Secondary | ICD-10-CM | POA: Diagnosis not present

## 2016-09-23 DIAGNOSIS — Z1389 Encounter for screening for other disorder: Secondary | ICD-10-CM | POA: Diagnosis not present

## 2016-09-23 DIAGNOSIS — E559 Vitamin D deficiency, unspecified: Secondary | ICD-10-CM | POA: Diagnosis not present

## 2016-09-23 DIAGNOSIS — E039 Hypothyroidism, unspecified: Secondary | ICD-10-CM | POA: Diagnosis not present

## 2016-09-23 DIAGNOSIS — J45909 Unspecified asthma, uncomplicated: Secondary | ICD-10-CM | POA: Diagnosis not present

## 2016-10-20 DIAGNOSIS — N7689 Other specified inflammation of vagina and vulva: Secondary | ICD-10-CM | POA: Diagnosis not present

## 2016-11-02 ENCOUNTER — Encounter: Payer: Self-pay | Admitting: Gastroenterology

## 2016-11-02 ENCOUNTER — Ambulatory Visit (AMBULATORY_SURGERY_CENTER): Payer: Self-pay

## 2016-11-02 VITALS — Ht 64.5 in | Wt 163.0 lb

## 2016-11-02 DIAGNOSIS — Z86010 Personal history of colonic polyps: Secondary | ICD-10-CM

## 2016-11-02 MED ORDER — NA SULFATE-K SULFATE-MG SULF 17.5-3.13-1.6 GM/177ML PO SOLN: ORAL | 0 refills | Status: DC

## 2016-11-02 NOTE — Progress Notes (Signed)
Per pt, no allergies to soy or egg products.Pt not taking any weight loss meds or using  O2 at home.   Pt refuse Emmi video. 

## 2016-11-16 ENCOUNTER — Ambulatory Visit (AMBULATORY_SURGERY_CENTER): Payer: Medicare HMO | Admitting: Gastroenterology

## 2016-11-16 ENCOUNTER — Encounter: Payer: Self-pay | Admitting: Gastroenterology

## 2016-11-16 VITALS — BP 109/59 | HR 68 | Temp 98.6°F | Resp 14 | Ht 64.5 in | Wt 163.0 lb

## 2016-11-16 DIAGNOSIS — K219 Gastro-esophageal reflux disease without esophagitis: Secondary | ICD-10-CM | POA: Diagnosis not present

## 2016-11-16 DIAGNOSIS — I10 Essential (primary) hypertension: Secondary | ICD-10-CM | POA: Diagnosis not present

## 2016-11-16 DIAGNOSIS — Z86010 Personal history of colonic polyps: Secondary | ICD-10-CM

## 2016-11-16 DIAGNOSIS — E039 Hypothyroidism, unspecified: Secondary | ICD-10-CM | POA: Diagnosis not present

## 2016-11-16 DIAGNOSIS — E669 Obesity, unspecified: Secondary | ICD-10-CM | POA: Diagnosis not present

## 2016-11-16 MED ORDER — SODIUM CHLORIDE 0.9 % IV SOLN: 500.0000 mL | INTRAVENOUS | Status: AC

## 2016-11-16 NOTE — Patient Instructions (Signed)
YOU HAD AN ENDOSCOPIC PROCEDURE TODAY AT THE [facility]:   Refer to the procedure report that was given to you for any specific questions about what was found during the examination.  If the procedure report does not answer your questions, please call your gastroenterologist to clarify.  If you requested that your care partner not be given the details of your procedure findings, then the procedure report has been included in a sealed envelope for you to review at your convenience later.  YOU SHOULD EXPECT: Some feelings of bloating in the abdomen. Passage of more gas than usual.  Walking can help get rid of the air that was put into your GI tract during the procedure and reduce the bloating. If you had a lower endoscopy (such as a colonoscopy or flexible sigmoidoscopy) you may notice spotting of blood in your stool or on the toilet paper. If you underwent a bowel prep for your procedure, you may not have a normal bowel movement for a few days.  Please Note:  You might notice some irritation and congestion in your nose or some drainage.  This is from the oxygen used during your procedure.  There is no need for concern and it should clear up in a day or so.  SYMPTOMS TO REPORT IMMEDIATELY:   Following lower endoscopy (colonoscopy or flexible sigmoidoscopy):  Excessive amounts of blood in the stool  Significant tenderness or worsening of abdominal pains  Swelling of the abdomen that is new, acute  Fever of 100F or higher  For urgent or emergent issues, a gastroenterologist can be reached at any hour by calling [phone].   DIET:  We do recommend a small meal at first, but then you may proceed to your regular diet.  Drink plenty of fluids but you should avoid alcoholic beverages for 24 hours.  ACTIVITY:  You should plan to take it easy for the rest of today and you should NOT DRIVE or use heavy machinery until tomorrow (because of the sedation medicines used during the test).     FOLLOW UP: Our staff will call the number listed on your records the next business day following your procedure to check on you and address any questions or concerns that you may have regarding the information given to you following your procedure. If we do not reach you, we will leave a message.  However, if you are feeling well and you are not experiencing any problems, there is no need to return our call.  We will assume that you have returned to your regular daily activities without incident.  If any biopsies were taken you will be contacted by phone or by letter within the next 1-3 weeks.  Please call us at [phone] if you have not heard about the biopsies in 3 weeks.    SIGNATURES/CONFIDENTIALITY: You and/or your care partner have signed paperwork which will be entered into your electronic medical record.  These signatures attest to the fact that that the information above on your After Visit Summary has been reviewed and is understood.  Full responsibility of the confidentiality of this discharge information lies with you and/or your care-partner. 

## 2016-11-16 NOTE — Op Note (Addendum)
[facility] Patient Name: Brandi Richards Procedure Date: 11/16/2016 7:52 AM MRN: [id number] Endoscopist: Mallie Mussel L. Loletha Carrow , MD Age: 71 Referring MD:  Date of Birth: 1946-02-10 Gender: Female Account #: [id number] Procedure:                Colonoscopy Indications:              Surveillance: History of adenomatous polyps,                            inadequate prep on last exam (<56yr) - one of which                            had HGD Medicines:                Monitored Anesthesia Care Procedure:                Pre-Anesthesia Assessment:                           - Prior to the procedure, a History and Physical                            was performed, and patient medications and                            allergies were reviewed. The patient's tolerance of                            previous anesthesia was also reviewed. The risks                            and benefits of the procedure and the sedation                            options and risks were discussed with the patient.                            All questions were answered, and informed consent                            was obtained. Anticoagulants: The patient has taken                            aspirin. It was decided not to withhold this                            medication prior to the procedure. ASA Grade                            Assessment: II - A patient with mild systemic                            disease. After reviewing the risks and benefits,  the patient was deemed in satisfactory condition to                            undergo the procedure.                           After obtaining informed consent, the colonoscope                            was passed under direct vision. Throughout the                            procedure, the patient's blood pressure, pulse, and                            oxygen saturations were monitored continuously. The   Colonoscope was introduced through the anus and                            advanced to the the cecum, identified by                            appendiceal orifice and ileocecal valve. The                            colonoscopy was performed with moderate difficulty                            due to significant looping. Successful completion                            of the procedure was aided by using manual                            pressure. The patient tolerated the procedure well.                            The patient tolerated the procedure well. The                            quality of the bowel preparation was excellent. The                            ileocecal valve, appendiceal orifice, and rectum                            were photographed. The quality of the bowel                            preparation was evaluated using the BBPS [facility]                            Bowel Preparation Scale) with scores of: Right  Colon = 3, Transverse Colon = 3 and Left Colon = 3                            (entire mucosa seen well with no residual staining,                            small fragments of stool or opaque liquid). The                            total BBPS score equals 9. The bowel preparation                            used was Miralax and SUPREP. Scope In: 7:57:29 AM Scope Out: 8:21:41 AM Scope Withdrawal Time: 0 hours 11 minutes 3 seconds  Total Procedure Duration: 0 hours 24 minutes 12 seconds  Findings:                 The perianal and digital rectal examinations were                            normal.                           The colon (entire examined portion) was                            significantly redundant.                           The exam was otherwise without abnormality on                            direct and retroflexion views. Complications:            No immediate complications. Estimated Blood Loss:     Estimated blood loss:  none. Impression:               - Redundant colon.                           - The examination was otherwise normal on direct                            and retroflexion views.                           - No specimens collected. Recommendation:           - Patient has a contact number available for                            emergencies. The signs and symptoms of potential                            delayed complications were discussed with the  patient. Return to normal activities tomorrow.                            Written discharge instructions were provided to the                            patient.                           - Resume previous diet.                           - Continue present medications.                           - Repeat colonoscopy in 3 years for surveillance. Henry L. Loletha Carrow, MD 11/16/2016 8:30:01 AM This report has been signed electronically.

## 2016-11-16 NOTE — Progress Notes (Signed)
Report to PACU, RN, vss, BBS= Clear.  

## 2016-11-17 ENCOUNTER — Telehealth: Payer: Self-pay | Admitting: *Deleted

## 2016-11-17 ENCOUNTER — Other Ambulatory Visit: Payer: Self-pay | Admitting: Family Medicine

## 2016-11-17 DIAGNOSIS — Z1231 Encounter for screening mammogram for malignant neoplasm of breast: Secondary | ICD-10-CM

## 2016-11-17 NOTE — Telephone Encounter (Signed)
  Follow up Call-  Call back number 11/16/2016 11/13/2015  Post procedure Call Back phone  # [phone]  Permission to leave phone message Yes Yes  Some recent data might be hidden     Patient questions:  Do you have a fever, pain , or abdominal swelling? No. Pain Score  0 *  Have you tolerated food without any problems? Yes.    Have you been able to return to your normal activities? Yes.    Do you have any questions about your discharge instructions: Diet   No. Medications  No. Follow up visit  No.  Do you have questions or concerns about your Care? No.  Actions: * If pain score is 4 or above: No action needed, pain <4.

## 2016-12-27 ENCOUNTER — Ambulatory Visit
Admission: RE | Admit: 2016-12-27 | Discharge: 2016-12-27 | Disposition: A | Discharge status: Home / Self Care | Payer: Medicare HMO | Source: Ambulatory Visit | Attending: Family Medicine | Admitting: Family Medicine

## 2016-12-27 DIAGNOSIS — Z1231 Encounter for screening mammogram for malignant neoplasm of breast: Secondary | ICD-10-CM

## 2017-01-05 ENCOUNTER — Other Ambulatory Visit: Payer: Self-pay | Admitting: Family Medicine

## 2017-01-05 ENCOUNTER — Ambulatory Visit
Admission: RE | Admit: 2017-01-05 | Discharge: 2017-01-05 | Disposition: A | Discharge status: Home / Self Care | Payer: Medicare HMO | Source: Ambulatory Visit | Attending: Family Medicine | Admitting: Family Medicine

## 2017-01-05 DIAGNOSIS — M79672 Pain in left foot: Secondary | ICD-10-CM | POA: Diagnosis not present

## 2017-01-05 DIAGNOSIS — M7752 Other enthesopathy of left foot: Secondary | ICD-10-CM | POA: Diagnosis not present

## 2017-01-05 DIAGNOSIS — M898X7 Other specified disorders of bone, ankle and foot: Secondary | ICD-10-CM | POA: Diagnosis not present

## 2017-01-05 DIAGNOSIS — E7800 Pure hypercholesterolemia, unspecified: Secondary | ICD-10-CM | POA: Diagnosis not present

## 2017-03-24 DIAGNOSIS — N898 Other specified noninflammatory disorders of vagina: Secondary | ICD-10-CM | POA: Diagnosis not present

## 2017-03-24 DIAGNOSIS — I10 Essential (primary) hypertension: Secondary | ICD-10-CM | POA: Diagnosis not present

## 2017-03-24 DIAGNOSIS — Z23 Encounter for immunization: Secondary | ICD-10-CM | POA: Diagnosis not present

## 2017-03-24 DIAGNOSIS — L719 Rosacea, unspecified: Secondary | ICD-10-CM | POA: Diagnosis not present

## 2017-03-24 DIAGNOSIS — E7800 Pure hypercholesterolemia, unspecified: Secondary | ICD-10-CM | POA: Diagnosis not present

## 2017-03-24 DIAGNOSIS — E039 Hypothyroidism, unspecified: Secondary | ICD-10-CM | POA: Diagnosis not present

## 2017-05-05 DIAGNOSIS — R609 Edema, unspecified: Secondary | ICD-10-CM | POA: Diagnosis not present

## 2017-05-05 DIAGNOSIS — I10 Essential (primary) hypertension: Secondary | ICD-10-CM | POA: Diagnosis not present

## 2017-05-05 DIAGNOSIS — R600 Localized edema: Secondary | ICD-10-CM | POA: Diagnosis not present

## 2017-06-27 DIAGNOSIS — N763 Subacute and chronic vulvitis: Secondary | ICD-10-CM | POA: Diagnosis not present

## 2017-07-26 DIAGNOSIS — N763 Subacute and chronic vulvitis: Secondary | ICD-10-CM | POA: Diagnosis not present

## 2017-09-27 DIAGNOSIS — K219 Gastro-esophageal reflux disease without esophagitis: Secondary | ICD-10-CM | POA: Diagnosis not present

## 2017-09-27 DIAGNOSIS — Z0001 Encounter for general adult medical examination with abnormal findings: Secondary | ICD-10-CM | POA: Diagnosis not present

## 2017-09-27 DIAGNOSIS — Z1389 Encounter for screening for other disorder: Secondary | ICD-10-CM | POA: Diagnosis not present

## 2017-09-27 DIAGNOSIS — M2550 Pain in unspecified joint: Secondary | ICD-10-CM | POA: Diagnosis not present

## 2017-09-27 DIAGNOSIS — E039 Hypothyroidism, unspecified: Secondary | ICD-10-CM | POA: Diagnosis not present

## 2017-09-27 DIAGNOSIS — E559 Vitamin D deficiency, unspecified: Secondary | ICD-10-CM | POA: Diagnosis not present

## 2017-09-27 DIAGNOSIS — E7800 Pure hypercholesterolemia, unspecified: Secondary | ICD-10-CM | POA: Diagnosis not present

## 2017-09-27 DIAGNOSIS — I10 Essential (primary) hypertension: Secondary | ICD-10-CM | POA: Diagnosis not present

## 2017-10-26 DIAGNOSIS — N763 Subacute and chronic vulvitis: Secondary | ICD-10-CM | POA: Diagnosis not present

## 2017-11-01 DIAGNOSIS — M25511 Pain in right shoulder: Secondary | ICD-10-CM | POA: Diagnosis not present

## 2017-11-08 DIAGNOSIS — M25411 Effusion, right shoulder: Secondary | ICD-10-CM | POA: Diagnosis not present

## 2017-11-08 DIAGNOSIS — M75111 Incomplete rotator cuff tear or rupture of right shoulder, not specified as traumatic: Secondary | ICD-10-CM | POA: Diagnosis not present

## 2017-11-08 DIAGNOSIS — M24111 Other articular cartilage disorders, right shoulder: Secondary | ICD-10-CM | POA: Diagnosis not present

## 2017-11-08 DIAGNOSIS — M94211 Chondromalacia, right shoulder: Secondary | ICD-10-CM | POA: Diagnosis not present

## 2017-11-08 DIAGNOSIS — M7581 Other shoulder lesions, right shoulder: Secondary | ICD-10-CM | POA: Diagnosis not present

## 2017-11-08 DIAGNOSIS — M19011 Primary osteoarthritis, right shoulder: Secondary | ICD-10-CM | POA: Diagnosis not present

## 2017-11-08 DIAGNOSIS — M67813 Other specified disorders of tendon, right shoulder: Secondary | ICD-10-CM | POA: Diagnosis not present

## 2017-11-14 DIAGNOSIS — M75121 Complete rotator cuff tear or rupture of right shoulder, not specified as traumatic: Secondary | ICD-10-CM | POA: Diagnosis not present

## 2017-11-14 DIAGNOSIS — M25511 Pain in right shoulder: Secondary | ICD-10-CM | POA: Diagnosis not present

## 2017-11-23 NOTE — Patient Instructions (Signed)
VAUGHN FRIEZE  11/23/2017   Your procedure is scheduled on: 11-30-17   Report to [facility]  Entrance    Report to admitting at 10:00AM    Call this number if you have problems the morning of surgery [phone]     Remember: Do not eat food or drink liquids :After Midnight.     Take these medicines the morning of surgery with A SIP OF WATER: XANAX IF NEEDED, LEVOTHYROXINE, OMEPRAZOLE                                 You may not have any metal on your body including hair pins and              piercings  Do not wear jewelry, make-up, lotions, powders or perfumes, deodorant             Do not wear nail polish.  Do not shave  48 hours prior to surgery.           Do not bring valuables to the hospital. [place].  Contacts, dentures or bridgework may not be worn into surgery.  Leave suitcase in the car. After surgery it may be brought to your room.                   Please read over the following fact sheets you were given: _____________________________________________________________________             [facility] - Preparing for Surgery Before surgery, you can play an important role.  Because skin is not sterile, your skin needs to be as free of germs as possible.  You can reduce the number of germs on your skin by washing with CHG (chlorahexidine gluconate) soap before surgery.  CHG is an antiseptic cleaner which kills germs and bonds with the skin to continue killing germs even after washing. Please DO NOT use if you have an allergy to CHG or antibacterial soaps.  If your skin becomes reddened/irritated stop using the CHG and inform your nurse when you arrive at Short Stay. Do not shave (including legs and underarms) for at least 48 hours prior to the first CHG shower.  You may shave your face/neck. Please follow these instructions carefully:  1.  Shower with CHG Soap the night before surgery and  the  morning of Surgery.  2.  If you choose to wash your hair, wash your hair first as usual with your  normal  shampoo.  3.  After you shampoo, rinse your hair and body thoroughly to remove the  shampoo.                           4.  Use CHG as you would any other liquid soap.  You can apply chg directly  to the skin and wash                       Gently with a scrungie or clean washcloth.  5.  Apply the CHG Soap to your body ONLY FROM THE NECK DOWN.   Do not use on face/ open  Wound or open sores. Avoid contact with eyes, ears mouth and genitals (private parts).                       Wash face,  Genitals (private parts) with your normal soap.             6.  Wash thoroughly, paying special attention to the area where your surgery  will be performed.  7.  Thoroughly rinse your body with warm water from the neck down.  8.  DO NOT shower/wash with your normal soap after using and rinsing off  the CHG Soap.                9.  Pat yourself dry with a clean towel.            10.  Wear clean pajamas.            11.  Place clean sheets on your bed the night of your first shower and do not  sleep with pets. Day of Surgery : Do not apply any lotions/deodorants the morning of surgery.  Please wear clean clothes to the hospital/surgery center.  FAILURE TO FOLLOW THESE INSTRUCTIONS MAY RESULT IN THE CANCELLATION OF YOUR SURGERY PATIENT SIGNATURE_________________________________  NURSE SIGNATURE__________________________________  ________________________________________________________________________   Adam Phenix  An incentive spirometer is a tool that can help keep your lungs clear and active. This tool measures how well you are filling your lungs with each breath. Taking long deep breaths may help reverse or decrease the chance of developing breathing (pulmonary) problems (especially infection) following:  A long period of time when you are unable to move or be  active. BEFORE THE PROCEDURE   If the spirometer includes an indicator to show your best effort, your nurse or respiratory therapist will set it to a desired goal.  If possible, sit up straight or lean slightly forward. Try not to slouch.  Hold the incentive spirometer in an upright position. INSTRUCTIONS FOR USE  1. Sit on the edge of your bed if possible, or sit up as far as you can in bed or on a chair. 2. Hold the incentive spirometer in an upright position. 3. Breathe out normally. 4. Place the mouthpiece in your mouth and seal your lips tightly around it. 5. Breathe in slowly and as deeply as possible, raising the piston or the ball toward the top of the column. 6. Hold your breath for 3-5 seconds or for as long as possible. Allow the piston or ball to fall to the bottom of the column. 7. Remove the mouthpiece from your mouth and breathe out normally. 8. Rest for a few seconds and repeat Steps 1 through 7 at least 10 times every 1-2 hours when you are awake. Take your time and take a few normal breaths between deep breaths. 9. The spirometer may include an indicator to show your best effort. Use the indicator as a goal to work toward during each repetition. 10. After each set of 10 deep breaths, practice coughing to be sure your lungs are clear. If you have an incision (the cut made at the time of surgery), support your incision when coughing by placing a pillow or rolled up towels firmly against it. Once you are able to get out of bed, walk around indoors and cough well. You may stop using the incentive spirometer when instructed by your caregiver.  RISKS AND COMPLICATIONS  Take your time so you do not get  dizzy or light-headed.  If you are in pain, you may need to take or ask for pain medication before doing incentive spirometry. It is harder to take a deep breath if you are having pain. AFTER USE  Rest and breathe slowly and easily.  It can be helpful to keep track of a log of  your progress. Your caregiver can provide you with a simple table to help with this. If you are using the spirometer at home, follow these instructions: [place] IF:   You are having difficultly using the spirometer.  You have trouble using the spirometer as often as instructed.  Your pain medication is not giving enough relief while using the spirometer.  You develop fever of 100.5 F (38.1 C) or higher. SEEK IMMEDIATE MEDICAL CARE IF:   You cough up bloody sputum that had not been present before.  You develop fever of 102 F (38.9 C) or greater.  You develop worsening pain at or near the incision site. MAKE SURE YOU:   Understand these instructions.  Will watch your condition.  Will get help right away if you are not doing well or get worse. Document Released: 09/27/2006 Document Revised: 08/09/2011 Document Reviewed: 11/28/2006 [facility] Patient Information 2014 [place], [place].   ________________________________________________________________________

## 2017-11-24 ENCOUNTER — Encounter (HOSPITAL_COMMUNITY)
Admission: RE | Admit: 2017-11-24 | Discharge: 2017-11-24 | Disposition: A | Discharge status: Home / Self Care | Payer: Medicare HMO | Source: Ambulatory Visit | Attending: Orthopedic Surgery | Admitting: Orthopedic Surgery

## 2017-11-24 ENCOUNTER — Encounter (HOSPITAL_COMMUNITY): Payer: Self-pay

## 2017-11-24 ENCOUNTER — Other Ambulatory Visit: Payer: Self-pay

## 2017-11-24 DIAGNOSIS — Z01812 Encounter for preprocedural laboratory examination: Secondary | ICD-10-CM | POA: Diagnosis not present

## 2017-11-24 DIAGNOSIS — M75101 Unspecified rotator cuff tear or rupture of right shoulder, not specified as traumatic: Secondary | ICD-10-CM | POA: Insufficient documentation

## 2017-11-24 DIAGNOSIS — Z01810 Encounter for preprocedural cardiovascular examination: Secondary | ICD-10-CM | POA: Insufficient documentation

## 2017-11-24 DIAGNOSIS — I10 Essential (primary) hypertension: Secondary | ICD-10-CM | POA: Diagnosis not present

## 2017-11-24 LAB — COMPREHENSIVE METABOLIC PANEL
ALT: 28 U/L (ref 0–44)
AST: 30 U/L (ref 15–41)
Albumin: 4.1 g/dL (ref 3.5–5.0)
Alkaline Phosphatase: 59 U/L (ref 38–126)
Anion gap: 8 (ref 5–15)
BUN: 16 mg/dL (ref 8–23)
CO2: 27 mmol/L (ref 22–32)
Calcium: 9.2 mg/dL (ref 8.9–10.3)
Chloride: 104 mmol/L (ref 98–111)
Creatinine, Ser: 0.94 mg/dL (ref 0.44–1.00)
GFR calc Af Amer: >60 mL/min (ref >60)
GFR calc non Af Amer: 59 mL/min — ABNORMAL LOW (ref >60)
Glucose, Bld: 97 mg/dL (ref 70–99)
Potassium: 3.8 mmol/L (ref 3.5–5.1)
Sodium: 139 mmol/L (ref 135–145)
Total Bilirubin: 0.4 mg/dL (ref 0.3–1.2)
Total Protein: 7.1 g/dL (ref 6.5–8.1)

## 2017-11-24 LAB — CBC WITH DIFFERENTIAL/PLATELET
Basophils Absolute: 0 10*3/uL (ref 0.0–0.1)
Basophils Relative: 1 %
Eosinophils Absolute: 0.2 10*3/uL (ref 0.0–0.7)
Eosinophils Relative: 4 %
HCT: 43.9 % (ref 36.0–46.0)
Hemoglobin: 14.9 g/dL (ref 12.0–15.0)
Lymphocytes Relative: 40 %
Lymphs Abs: 1.8 10*3/uL (ref 0.7–4.0)
MCH: 33 pg (ref 26.0–34.0)
MCHC: 33.9 g/dL (ref 30.0–36.0)
MCV: 97.3 fL (ref 78.0–100.0)
Monocytes Absolute: 0.4 10*3/uL (ref 0.1–1.0)
Monocytes Relative: 10 %
Neutro Abs: 2 10*3/uL (ref 1.7–7.7)
Neutrophils Relative %: 45 %
Platelets: 225 10*3/uL (ref 150–400)
RBC: 4.51 MIL/uL (ref 3.87–5.11)
RDW: 12.5 % (ref 11.5–15.5)
WBC: 4.4 10*3/uL (ref 4.0–10.5)

## 2017-11-24 LAB — APTT: aPTT: 32 seconds (ref 24–36)

## 2017-11-24 LAB — ABO/RH: ABO/RH(D): A POS

## 2017-11-24 LAB — PROTIME-INR
INR: 0.96
Prothrombin Time: 12.7 seconds (ref 11.4–15.2)

## 2017-11-30 ENCOUNTER — Encounter (HOSPITAL_COMMUNITY): Payer: Self-pay | Admitting: *Deleted

## 2017-11-30 ENCOUNTER — Encounter (HOSPITAL_COMMUNITY)
Admission: RE | Disposition: A | Discharge status: Home / Self Care | Payer: Self-pay | Source: Ambulatory Visit | Attending: Orthopedic Surgery

## 2017-11-30 ENCOUNTER — Observation Stay (HOSPITAL_COMMUNITY)
Admission: RE | Admit: 2017-11-30 | Discharge: 2017-12-01 | Disposition: A | Discharge status: Home / Self Care | Payer: Medicare HMO | Source: Ambulatory Visit | Attending: Orthopedic Surgery | Admitting: Orthopedic Surgery

## 2017-11-30 ENCOUNTER — Other Ambulatory Visit: Payer: Self-pay

## 2017-11-30 ENCOUNTER — Ambulatory Visit (HOSPITAL_COMMUNITY): Payer: Medicare HMO | Admitting: Certified Registered Nurse Anesthetist

## 2017-11-30 DIAGNOSIS — Z79899 Other long term (current) drug therapy: Secondary | ICD-10-CM | POA: Insufficient documentation

## 2017-11-30 DIAGNOSIS — K219 Gastro-esophageal reflux disease without esophagitis: Secondary | ICD-10-CM | POA: Diagnosis not present

## 2017-11-30 DIAGNOSIS — Z885 Allergy status to narcotic agent status: Secondary | ICD-10-CM | POA: Insufficient documentation

## 2017-11-30 DIAGNOSIS — Z9841 Cataract extraction status, right eye: Secondary | ICD-10-CM | POA: Diagnosis not present

## 2017-11-30 DIAGNOSIS — Z79890 Hormone replacement therapy (postmenopausal): Secondary | ICD-10-CM | POA: Insufficient documentation

## 2017-11-30 DIAGNOSIS — M7541 Impingement syndrome of right shoulder: Secondary | ICD-10-CM | POA: Diagnosis not present

## 2017-11-30 DIAGNOSIS — I10 Essential (primary) hypertension: Secondary | ICD-10-CM | POA: Diagnosis not present

## 2017-11-30 DIAGNOSIS — Z86010 Personal history of colonic polyps: Secondary | ICD-10-CM

## 2017-11-30 DIAGNOSIS — Z882 Allergy status to sulfonamides status: Secondary | ICD-10-CM | POA: Insufficient documentation

## 2017-11-30 DIAGNOSIS — G8918 Other acute postprocedural pain: Secondary | ICD-10-CM | POA: Diagnosis not present

## 2017-11-30 DIAGNOSIS — M75121 Complete rotator cuff tear or rupture of right shoulder, not specified as traumatic: Secondary | ICD-10-CM | POA: Diagnosis not present

## 2017-11-30 DIAGNOSIS — G2581 Restless legs syndrome: Secondary | ICD-10-CM | POA: Insufficient documentation

## 2017-11-30 DIAGNOSIS — E785 Hyperlipidemia, unspecified: Secondary | ICD-10-CM | POA: Insufficient documentation

## 2017-11-30 DIAGNOSIS — E039 Hypothyroidism, unspecified: Secondary | ICD-10-CM | POA: Diagnosis not present

## 2017-11-30 DIAGNOSIS — Z9842 Cataract extraction status, left eye: Secondary | ICD-10-CM | POA: Diagnosis not present

## 2017-11-30 DIAGNOSIS — M12811 Other specific arthropathies, not elsewhere classified, right shoulder: Secondary | ICD-10-CM | POA: Diagnosis present

## 2017-11-30 DIAGNOSIS — Z7982 Long term (current) use of aspirin: Secondary | ICD-10-CM | POA: Diagnosis not present

## 2017-11-30 DIAGNOSIS — M75101 Unspecified rotator cuff tear or rupture of right shoulder, not specified as traumatic: Secondary | ICD-10-CM

## 2017-11-30 DIAGNOSIS — M7551 Bursitis of right shoulder: Secondary | ICD-10-CM | POA: Diagnosis not present

## 2017-11-30 HISTORY — PX: SHOULDER OPEN ROTATOR CUFF REPAIR: SHX2407

## 2017-11-30 LAB — TYPE AND SCREEN
ABO/RH(D): A POS
Antibody Screen: NEGATIVE

## 2017-11-30 SURGERY — REPAIR, ROTATOR CUFF, OPEN
Anesthesia: General | Site: Shoulder | Laterality: Right

## 2017-11-30 MED ORDER — PHENYLEPHRINE HCL 10 MG/ML IJ SOLN
INTRAMUSCULAR | Status: AC
  Filled 2017-11-30: qty 1

## 2017-11-30 MED ORDER — LIDOCAINE 2% (20 MG/ML) 5 ML SYRINGE
INTRAMUSCULAR | Status: DC | PRN
  Administered 2017-11-30: 80 mg via INTRAVENOUS

## 2017-11-30 MED ORDER — ROCURONIUM BROMIDE 50 MG/5ML IV SOSY
PREFILLED_SYRINGE | INTRAVENOUS | Status: DC | PRN
  Administered 2017-11-30: 40 mg via INTRAVENOUS

## 2017-11-30 MED ORDER — SODIUM CHLORIDE 0.9 % IV SOLN
INTRAVENOUS | Status: DC | PRN
  Administered 2017-11-30: 500 mL

## 2017-11-30 MED ORDER — ONDANSETRON HCL 4 MG/2ML IJ SOLN
INTRAMUSCULAR | Status: AC
  Filled 2017-11-30: qty 4

## 2017-11-30 MED ORDER — FENTANYL CITRATE (PF) 100 MCG/2ML IJ SOLN
50.0000 ug | Freq: Once | INTRAMUSCULAR | Status: AC
  Administered 2017-11-30: 75 ug via INTRAVENOUS
  Filled 2017-11-30: qty 2

## 2017-11-30 MED ORDER — FENTANYL CITRATE (PF) 100 MCG/2ML IJ SOLN
INTRAMUSCULAR | Status: DC | PRN
  Administered 2017-11-30 (×2): 25 ug via INTRAVENOUS
  Administered 2017-11-30: 50 ug via INTRAVENOUS

## 2017-11-30 MED ORDER — OXYCODONE HCL 5 MG PO TABS: 5.0000 mg | ORAL_TABLET | ORAL | Status: DC | PRN

## 2017-11-30 MED ORDER — PHENYLEPHRINE 40 MCG/ML (10ML) SYRINGE FOR IV PUSH (FOR BLOOD PRESSURE SUPPORT)
PREFILLED_SYRINGE | INTRAVENOUS | Status: DC | PRN
  Administered 2017-11-30: 80 ug via INTRAVENOUS

## 2017-11-30 MED ORDER — LEVOTHYROXINE SODIUM 112 MCG PO TABS
112.0000 ug | ORAL_TABLET | Freq: Every day | ORAL | Status: DC
  Administered 2017-12-01: 112 ug via ORAL
  Filled 2017-11-30: qty 1

## 2017-11-30 MED ORDER — ACETAMINOPHEN 325 MG PO TABS: 325.0000 mg | ORAL_TABLET | Freq: Four times a day (QID) | ORAL | Status: DC | PRN

## 2017-11-30 MED ORDER — DEXAMETHASONE SODIUM PHOSPHATE 10 MG/ML IJ SOLN
INTRAMUSCULAR | Status: DC | PRN
  Administered 2017-11-30: 10 mg via INTRAVENOUS

## 2017-11-30 MED ORDER — PROPOFOL 10 MG/ML IV BOLUS
INTRAVENOUS | Status: AC
Start: 2017-11-30 — End: ?
  Filled 2017-11-30: qty 20

## 2017-11-30 MED ORDER — ALPRAZOLAM 0.25 MG PO TABS: 0.2500 mg | ORAL_TABLET | Freq: Every evening | ORAL | Status: DC | PRN

## 2017-11-30 MED ORDER — ONDANSETRON HCL 4 MG/2ML IJ SOLN: 4.0000 mg | Freq: Once | INTRAMUSCULAR | Status: DC | PRN

## 2017-11-30 MED ORDER — MENTHOL 3 MG MT LOZG: 1.0000 | LOZENGE | OROMUCOSAL | Status: DC | PRN

## 2017-11-30 MED ORDER — LACTATED RINGERS IV SOLN
INTRAVENOUS | Status: DC
  Administered 2017-11-30: 18:00:00 via INTRAVENOUS

## 2017-11-30 MED ORDER — BUPIVACAINE LIPOSOME 1.3 % IJ SUSP
INTRAMUSCULAR | Status: DC | PRN
  Administered 2017-11-30: 10 mL

## 2017-11-30 MED ORDER — DEXAMETHASONE SODIUM PHOSPHATE 10 MG/ML IJ SOLN
INTRAMUSCULAR | Status: AC
  Filled 2017-11-30: qty 2

## 2017-11-30 MED ORDER — PANTOPRAZOLE SODIUM 40 MG PO TBEC: 40.0000 mg | DELAYED_RELEASE_TABLET | Freq: Every day | ORAL | Status: DC

## 2017-11-30 MED ORDER — DOCUSATE SODIUM 100 MG PO CAPS
100.0000 mg | ORAL_CAPSULE | Freq: Two times a day (BID) | ORAL | Status: DC
  Administered 2017-11-30: 100 mg via ORAL
  Filled 2017-11-30: qty 1

## 2017-11-30 MED ORDER — BISACODYL 5 MG PO TBEC: 5.0000 mg | DELAYED_RELEASE_TABLET | Freq: Every day | ORAL | Status: DC | PRN

## 2017-11-30 MED ORDER — FENTANYL CITRATE (PF) 100 MCG/2ML IJ SOLN
INTRAMUSCULAR | Status: AC
  Filled 2017-11-30: qty 2

## 2017-11-30 MED ORDER — CEFAZOLIN SODIUM-DEXTROSE 2-4 GM/100ML-% IV SOLN
2.0000 g | INTRAVENOUS | Status: AC
  Administered 2017-11-30: 2 g via INTRAVENOUS
  Filled 2017-11-30: qty 100

## 2017-11-30 MED ORDER — FENTANYL CITRATE (PF) 100 MCG/2ML IJ SOLN: 25.0000 ug | INTRAMUSCULAR | Status: DC | PRN

## 2017-11-30 MED ORDER — PHENOL 1.4 % MT LIQD: 1.0000 | OROMUCOSAL | Status: DC | PRN

## 2017-11-30 MED ORDER — SODIUM CHLORIDE 0.9 % IV SOLN
INTRAVENOUS | Status: AC
  Filled 2017-11-30: qty 500000

## 2017-11-30 MED ORDER — SODIUM CHLORIDE 0.9 % IV SOLN
INTRAVENOUS | Status: DC | PRN
  Administered 2017-11-30: 25 ug/min via INTRAVENOUS

## 2017-11-30 MED ORDER — 0.9 % SODIUM CHLORIDE (POUR BTL) OPTIME
TOPICAL | Status: DC | PRN
  Administered 2017-11-30: 1000 mL

## 2017-11-30 MED ORDER — MEPERIDINE HCL 50 MG/ML IJ SOLN: 6.2500 mg | INTRAMUSCULAR | Status: DC | PRN

## 2017-11-30 MED ORDER — SUGAMMADEX SODIUM 200 MG/2ML IV SOLN
INTRAVENOUS | Status: AC
  Filled 2017-11-30: qty 2

## 2017-11-30 MED ORDER — HYDROMORPHONE HCL 1 MG/ML IJ SOLN: 0.5000 mg | INTRAMUSCULAR | Status: DC | PRN

## 2017-11-30 MED ORDER — ONDANSETRON HCL 4 MG/2ML IJ SOLN
INTRAMUSCULAR | Status: DC | PRN
  Administered 2017-11-30: 4 mg via INTRAVENOUS

## 2017-11-30 MED ORDER — ACETAMINOPHEN 500 MG PO TABS
1000.0000 mg | ORAL_TABLET | Freq: Four times a day (QID) | ORAL | Status: DC
  Administered 2017-11-30 – 2017-12-01 (×3): 1000 mg via ORAL
  Filled 2017-11-30 (×3): qty 2

## 2017-11-30 MED ORDER — METOCLOPRAMIDE HCL 5 MG/ML IJ SOLN: 5.0000 mg | Freq: Three times a day (TID) | INTRAMUSCULAR | Status: DC | PRN

## 2017-11-30 MED ORDER — LACTATED RINGERS IV SOLN
INTRAVENOUS | Status: DC
  Administered 2017-11-30: 10:00:00 via INTRAVENOUS

## 2017-11-30 MED ORDER — OXYCODONE HCL 5 MG PO TABS: 5.0000 mg | ORAL_TABLET | ORAL | 0 refills | Status: DC | PRN

## 2017-11-30 MED ORDER — BUPIVACAINE-EPINEPHRINE (PF) 0.5% -1:200000 IJ SOLN
INTRAMUSCULAR | Status: DC | PRN
  Administered 2017-11-30: 15 mL via PERINEURAL

## 2017-11-30 MED ORDER — ONDANSETRON HCL 4 MG PO TABS: 4.0000 mg | ORAL_TABLET | Freq: Four times a day (QID) | ORAL | Status: DC | PRN

## 2017-11-30 MED ORDER — MIDAZOLAM HCL 2 MG/2ML IJ SOLN
1.0000 mg | Freq: Once | INTRAMUSCULAR | Status: DC
  Filled 2017-11-30: qty 2

## 2017-11-30 MED ORDER — FLEET ENEMA 7-19 GM/118ML RE ENEM: 1.0000 | ENEMA | Freq: Once | RECTAL | Status: DC | PRN

## 2017-11-30 MED ORDER — METOCLOPRAMIDE HCL 5 MG PO TABS: 5.0000 mg | ORAL_TABLET | Freq: Three times a day (TID) | ORAL | Status: DC | PRN

## 2017-11-30 MED ORDER — PROPOFOL 10 MG/ML IV BOLUS
INTRAVENOUS | Status: DC | PRN
  Administered 2017-11-30: 160 mg via INTRAVENOUS

## 2017-11-30 MED ORDER — POLYETHYLENE GLYCOL 3350 17 G PO PACK: 17.0000 g | PACK | Freq: Every day | ORAL | Status: DC | PRN

## 2017-11-30 MED ORDER — LISINOPRIL 20 MG PO TABS
20.0000 mg | ORAL_TABLET | Freq: Every day | ORAL | Status: DC
  Administered 2017-11-30: 20 mg via ORAL
  Filled 2017-11-30: qty 1

## 2017-11-30 MED ORDER — SUGAMMADEX SODIUM 200 MG/2ML IV SOLN
INTRAVENOUS | Status: DC | PRN
  Administered 2017-11-30: 200 mg via INTRAVENOUS

## 2017-11-30 MED ORDER — ONDANSETRON HCL 4 MG/2ML IJ SOLN: 4.0000 mg | Freq: Four times a day (QID) | INTRAMUSCULAR | Status: DC | PRN

## 2017-11-30 MED ORDER — CEFAZOLIN SODIUM-DEXTROSE 1-4 GM/50ML-% IV SOLN
1.0000 g | Freq: Four times a day (QID) | INTRAVENOUS | Status: AC
  Administered 2017-11-30 – 2017-12-01 (×3): 1 g via INTRAVENOUS
  Filled 2017-11-30 (×3): qty 50

## 2017-11-30 MED ORDER — HYDROCHLOROTHIAZIDE 25 MG PO TABS
25.0000 mg | ORAL_TABLET | Freq: Every day | ORAL | Status: DC
  Administered 2017-11-30: 25 mg via ORAL
  Filled 2017-11-30: qty 1

## 2017-11-30 MED ORDER — LIDOCAINE 2% (20 MG/ML) 5 ML SYRINGE
INTRAMUSCULAR | Status: AC
  Filled 2017-11-30: qty 10

## 2017-11-30 SURGICAL SUPPLY — 47 items
ATTRACTOMAT 16X20 MAGNETIC DRP (DRAPES) ×3 IMPLANT
BAG SPEC THK2 15X12 ZIP CLS (MISCELLANEOUS) ×1
BAG ZIPLOCK 12X15 (MISCELLANEOUS) ×2 IMPLANT
BLADE OSCILLATING/SAGITTAL (BLADE) ×3
BLADE SW THK.38XMED LNG THN (BLADE) ×1 IMPLANT
BNDG COHESIVE 6X5 TAN STRL LF (GAUZE/BANDAGES/DRESSINGS) ×3 IMPLANT
BUR OVAL CARBIDE 4.0 (BURR) ×3 IMPLANT
CLOSURE WOUND 1/2 X4 (GAUZE/BANDAGES/DRESSINGS) ×1
COVER SURGICAL LIGHT HANDLE (MISCELLANEOUS) ×3 IMPLANT
DRAPE POUCH INSTRU U-SHP 10X18 (DRAPES) ×3 IMPLANT
DRSG AQUACEL AG ADV 3.5X 6 (GAUZE/BANDAGES/DRESSINGS) ×3 IMPLANT
DURAPREP 26ML APPLICATOR (WOUND CARE) ×3 IMPLANT
DermaSpan ACD .5-.9mm 4x4cm (Graft) ×2 IMPLANT
ELECT BLADE TIP CTD 4 INCH (ELECTRODE) ×2 IMPLANT
ELECT REM PT RETURN 15FT ADLT (MISCELLANEOUS) ×3 IMPLANT
GLOVE BIOGEL PI IND STRL 6.5 (GLOVE) ×1 IMPLANT
GLOVE BIOGEL PI IND STRL 7.5 (GLOVE) IMPLANT
GLOVE BIOGEL PI IND STRL 8.5 (GLOVE) ×1 IMPLANT
GLOVE BIOGEL PI INDICATOR 6.5 (GLOVE) ×2
GLOVE BIOGEL PI INDICATOR 7.5 (GLOVE) ×4
GLOVE BIOGEL PI INDICATOR 8.5 (GLOVE) ×2
GLOVE ECLIPSE 8.0 STRL XLNG CF (GLOVE) ×3 IMPLANT
GLOVE SURG SS PI 6.5 STRL IVOR (GLOVE) ×3 IMPLANT
GLOVE SURG SS PI 7.5 STRL IVOR (GLOVE) ×4 IMPLANT
GOWN STRL REUS W/TWL 2XL LVL3 (GOWN DISPOSABLE) ×2 IMPLANT
GOWN STRL REUS W/TWL LRG LVL3 (GOWN DISPOSABLE) ×3 IMPLANT
GOWN STRL REUS W/TWL XL LVL3 (GOWN DISPOSABLE) ×3 IMPLANT
KIT BASIN OR (CUSTOM PROCEDURE TRAY) ×3 IMPLANT
KIT POSITION SHOULDER SCHLEI (MISCELLANEOUS) ×3 IMPLANT
MANIFOLD NEPTUNE II (INSTRUMENTS) ×3 IMPLANT
PACK SHOULDER (CUSTOM PROCEDURE TRAY) ×3 IMPLANT
POSITIONER SURGICAL ARM (MISCELLANEOUS) ×3 IMPLANT
SLING ARM FOAM STRAP LRG (SOFTGOODS) ×2 IMPLANT
SLING ARM IMMOBILIZER LRG (SOFTGOODS) ×3 IMPLANT
STRIP CLOSURE SKIN 1/2X4 (GAUZE/BANDAGES/DRESSINGS) ×2 IMPLANT
SUCTION FRAZIER HANDLE 12FR (TUBING) ×2
SUCTION TUBE FRAZIER 12FR DISP (TUBING) ×1 IMPLANT
SUT BONE WAX W31G (SUTURE) ×3 IMPLANT
SUT ETHIBOND NAB CT1 #1 30IN (SUTURE) ×7 IMPLANT
SUT MNCRL AB 4-0 PS2 18 (SUTURE) ×3 IMPLANT
SUT VIC AB 0 CT1 27 (SUTURE) ×3
SUT VIC AB 0 CT1 27XBRD ANTBC (SUTURE) IMPLANT
SUT VIC AB 1 CT1 27 (SUTURE) ×3
SUT VIC AB 1 CT1 27XBRD ANTBC (SUTURE) ×1 IMPLANT
SUT VIC AB 2-0 CT1 27 (SUTURE) ×6
SUT VIC AB 2-0 CT1 TAPERPNT 27 (SUTURE) ×1 IMPLANT
TOWEL OR 17X26 10 PK STRL BLUE (TOWEL DISPOSABLE) ×3 IMPLANT

## 2017-11-30 NOTE — Interval H&P Note (Signed)
History and Physical Interval Note:  11/30/2017 1:08 PM  Brandi Richards  has presented today for surgery, with the diagnosis of right shoulder rotator cuff tear  The various methods of treatment have been discussed with the patient and family. After consideration of risks, benefits and other options for treatment, the patient has consented to  Procedure(s): right shoulder open rotator cuff repair possible graft (Right) as a surgical intervention .  The patient's history has been reviewed, patient examined, no change in status, stable for surgery.  I have reviewed the patient's chart and labs.  Questions were answered to the patient's satisfaction.     [name]

## 2017-11-30 NOTE — Anesthesia Procedure Notes (Signed)
Procedure Name: Intubation Date/Time: 11/30/2017 1:35 PM Performed by: [place] Murna Backer C, CRNA Pre-anesthesia Checklist: Patient identified, Emergency Drugs available, Suction available and Patient being monitored Patient Re-evaluated:Patient Re-evaluated prior to induction Oxygen Delivery Method: Circle system utilized Preoxygenation: Pre-oxygenation with 100% oxygen Induction Type: IV induction Ventilation: Mask ventilation without difficulty Laryngoscope Size: Mac and 3 Grade View: Grade I Tube type: Oral Tube size: 7.0 mm Number of attempts: 1 Airway Equipment and Method: Stylet and Oral airway Placement Confirmation: ETT inserted through vocal cords under direct vision,  positive ETCO2 and breath sounds checked- equal and bilateral Secured at: 21 cm Tube secured with: Tape Dental Injury: Teeth and Oropharynx as per pre-operative assessment

## 2017-11-30 NOTE — Anesthesia Procedure Notes (Addendum)
Anesthesia Regional Block: Interscalene brachial plexus block   Pre-Anesthetic Checklist: ,, timeout performed, Correct Patient, Correct Site, Correct Laterality, Correct Procedure, Correct Position, site marked, Risks and benefits discussed,  Surgical consent,  Pre-op evaluation,  At surgeon's request and post-op pain management  Laterality: Right  Prep: chloraprep       Needles:  Injection technique: Single-shot  Needle Type: Echogenic Stimulator Needle     Needle Length: 9cm  Needle Gauge: 21   Needle insertion depth: 4 cm   Additional Needles:   Procedures:,,,, ultrasound used (permanent image in chart),,,,  Narrative:  Start time: 11/30/2017 10:49 AM End time: 11/30/2017 10:54 AM Injection made incrementally with aspirations every 5 mL.  Performed by: Personally  Anesthesiologist: Josephine Igo, MD  Additional Notes: Timeout performed. Patient sedated. Relevant anatomy ID'd using [place]. Incremental 2-53ml injection of LA with frequent aspiration. Patient tolerated procedure well.        Right Interscalene Block

## 2017-11-30 NOTE — Anesthesia Postprocedure Evaluation (Signed)
Anesthesia Post Note  Patient: Brandi Richards  Procedure(s) Performed: Right shoulder open acromionectomy and acromioplasty with repair of rotator cuff and DermaSpan graft (Right Shoulder)     Patient location during evaluation: PACU Anesthesia Type: General Level of consciousness: awake and alert and oriented Pain management: pain level controlled Vital Signs Assessment: post-procedure vital signs reviewed and stable Respiratory status: spontaneous breathing, nonlabored ventilation, respiratory function stable and patient connected to nasal cannula oxygen Cardiovascular status: blood pressure returned to baseline and stable Postop Assessment: no apparent nausea or vomiting Anesthetic complications: no Comments: Had some hoarseness to her voice along with some orthopnea which I suspect is due to her interscalene block.    Last Vitals:  Vitals:   11/30/17 1515 11/30/17 1530  BP: (!) 148/84 (!) 146/87  Pulse: 90 89  Resp: 17 (!) 24  Temp:    SpO2: 100% 100%    Last Pain:  Vitals:   11/30/17 1515  TempSrc:   PainSc: 0-No pain                 Vanissa Strength A.

## 2017-11-30 NOTE — Transfer of Care (Signed)
Immediate Anesthesia Transfer of Care Note  Patient: Brandi Richards  Procedure(s) Performed: Right shoulder open acromionectomy and acromioplasty with repair of rotator cuff and DermaSpan graft (Right Shoulder)  Patient Location: PACU  Anesthesia Type:General and Regional  Level of Consciousness: awake, alert  and oriented  Airway & Oxygen Therapy: Patient Spontanous Breathing and Patient connected to face mask oxygen  Post-op Assessment: Report given to RN and Post -op Vital signs reviewed and stable  Post vital signs: Reviewed and stable  Last Vitals:  Vitals Value Taken Time  BP 151/85 11/30/2017  2:54 PM  Temp    Pulse 98 11/30/2017  2:57 PM  Resp 26 11/30/2017  2:57 PM  SpO2 95 % 11/30/2017  2:57 PM  Vitals shown include unvalidated device data.  Last Pain:  Vitals:   11/30/17 1000  TempSrc: Oral         Complications: No apparent anesthesia complications

## 2017-11-30 NOTE — Progress Notes (Signed)
Assisted Dr. Foster with right, ultrasound guided, interscalene  block. Side rails up, monitors on throughout procedure. See vital signs in flow sheet. Tolerated Procedure well. 

## 2017-11-30 NOTE — H&P (Signed)
Brandi Richards is an 72 y.o. female.   Chief Complaint: Pain and limited motion of her Right Shoulder HPI: Progressive weakness of Right Shoulder.  Past Medical History:  Diagnosis Date  . Cataract   . Essential and other specified forms of tremor   . Hyperlipidemia   . Hypertension   . Movement disorder   . Nocturnal leg cramps   . Restless leg syndrome   . Thyroid disease     Past Surgical History:  Procedure Laterality Date  . BACK SURGERY    . CATARACT EXTRACTION     Bil  . CHOLECYSTECTOMY    . NASAL SINUS SURGERY    . PARTIAL HYSTERECTOMY    . PLANTAR FASCIECTOMY Left   . SHOULDER SURGERY Left   . TONSILLECTOMY    . TUBAL LIGATION      Family History  Problem Relation Age of Onset  . Lung cancer Father   . Thyroid disease Sister   . Macular degeneration Sister   . Osteoporosis Sister   . Colon cancer Neg Hx    Social History:  reports that she has never smoked. She has never used smokeless tobacco. She reports that she does not drink alcohol or use drugs.  Allergies:  Allergies  Allergen Reactions  . Morphine And Related Nausea And Vomiting    After shoulder surgery  . Primidone Nausea And Vomiting  . Sulfonamide Derivatives Hives and Rash    Facility-Administered Medications Prior to Admission  Medication Dose Route Frequency Provider Last Rate Last Dose  . 0.9 %  sodium chloride infusion  500 mL Intravenous Continuous Nelida Meuse III, MD       Medications Prior to Admission  Medication Sig Dispense Refill  . ALPRAZolam (XANAX) 0.25 MG tablet Take 1 tablet (0.25 mg total) by mouth 3 (three) times daily as needed. 270 tablet 1  . Cholecalciferol (VITAMIN D3) 1000 units CAPS Take 1,000 Units by mouth at bedtime.     . Flaxseed, Linseed, (FLAXSEED OIL) 1000 MG CAPS Take 1,000 mg by mouth at bedtime.    . hydrochlorothiazide (HYDRODIURIL) 25 MG tablet Take 25 mg by mouth daily.     Marland Kitchen levothyroxine (SYNTHROID, LEVOTHROID) 112 MCG tablet Take 112 mcg by  mouth daily before breakfast.    . lisinopril (PRINIVIL,ZESTRIL) 20 MG tablet Take 20 mg by mouth at bedtime.    . Multiple Vitamin (MULTIVITAMIN WITH MINERALS) TABS tablet Take 1 tablet by mouth daily.    . naproxen sodium (ALEVE) 220 MG tablet Take 220 mg by mouth at bedtime.    . Omega-3 Fatty Acids (FISH OIL) 1000 MG CAPS Take 1,000 mg by mouth at bedtime.     Marland Kitchen omeprazole (PRILOSEC) 20 MG capsule Take 20 mg by mouth daily before breakfast.  1  . pravastatin (PRAVACHOL) 40 MG tablet Take 40 mg by mouth at bedtime.     . vitamin B-12 (CYANOCOBALAMIN) 1000 MCG tablet Take 1,000 mcg by mouth daily.    Marland Kitchen aspirin 81 MG tablet Take 81 mg by mouth at bedtime.       No results found for this or any previous visit (from the past 48 hour(s)). No results found.  Review of Systems  Constitutional: Negative.   HENT: Negative.   Eyes: Negative.   Respiratory: Negative.   Cardiovascular: Negative.   Gastrointestinal: Negative.   Genitourinary: Negative.   Musculoskeletal: Positive for joint pain.  Skin: Negative.   Neurological: Negative.   Endo/Heme/Allergies: Negative.   Psychiatric/Behavioral:  Negative.     Blood pressure 124/76, pulse 89, temperature 98 F (36.7 C), temperature source Oral, resp. rate (!) 26, height 5\' 4"  (1.626 m), weight 81.2 kg (179 lb), SpO2 96 %. Physical Exam  Constitutional: She appears well-developed.  Eyes: Pupils are equal, round, and reactive to light.  Cardiovascular: Normal rate.  Respiratory: Effort normal.  Musculoskeletal: She exhibits tenderness.  Neurological: She is alert.  Skin: Skin is warm.  Psychiatric: She has a normal mood and affect.     Assessment/Plan Open Acromionectomy and Repair of Right Rotator Cuff and Possible Graft and anchor.  [name], MD 11/30/2017, 1:05 PM

## 2017-11-30 NOTE — Op Note (Signed)
NAME: Brandi Richards, Brandi Richards MEDICAL RECORD BS:[id number] ACCOUNT [id number] DATE OF BIRTH:01/08/1946 FACILITY: [facility] LOCATION: [facility] PHYSICIAN:Harrol Novello Fransico Setters, MD  OPERATIVE REPORT  DATE OF PROCEDURE:  11/30/2017  SURGEON:  [name], MD  ASSISTANT:  Ardeen Jourdain PA-C.  PREOPERATIVE DIAGNOSES:   1.  Severe impingement syndrome, right shoulder. 2.  Complete tear of the rotator cuff tendon, right shoulder. 3.  Subacromial bursitis.  POSTOPERATIVE DIAGNOSES:   1.  Severe impingement syndrome, right shoulder. 2.  Complete tear of the rotator cuff tendon, right shoulder. 3.  Subacromial bursitis.  OPERATIONS: 1.  Open acromionectomy and acromioplasty, right shoulder. 2.  Excision of the subacromial bursa, right shoulder. 3.  Repair of the rotator cuff tendon tear, right shoulder. 4.  Dermaspan graft, right shoulder.  No anchor was needed.  DESCRIPTION OF PROCEDURE:  Under general anesthesia, the patient on the beach chair position.  Routine orthopedic prep and draping of the right shoulder was carried out.  Appropriate timeout was carried out.  I also marked the appropriate right shoulder  in the holding area.  She had 2 grams of IV Ancef.  An incision was made over the anterior aspect of the right shoulder.  I went directly down on the acromion.  I stripped the deltoid tendon off the acromion by sharp dissection medially and laterally.  I  then split a small part of the deltoid muscle in the usual fashion.  Immediately upon going down to the joint, a large amount of fluid extruded.  We then protected the underlying cuff with a Bennett retractor.  I utilized the oscillating saw and a bur  to carry out an acromionectomy and acromioplasty in the usual fashion.  We irrigated out the area.  I then bone waxed the undersurface of the acromion.  Following that, we made sure we completed the bursectomy.  We now reestablished the subacromial  space.  We then went down and repaired the  rotator cuff and utilized the Dermaspan graft.  After that, we irrigated out the area.  We had good hemostasis.  I then reapproximated the deltoid tendon and muscle in usual fashion.  The remaining part of the  wound was closed with subcuticular sutures.  Sterile dressings were applied and she was placed in a sling.  Once again, Dr. Gladstone Lighter and the assistant Ardeen Jourdain PA.    She will be kept overnight for pain control.  She was also given an interscalene nerve block by Anesthesia preoperatively.  AN/NUANCE  D:11/30/2017 T:11/30/2017 JOB:[id number]

## 2017-11-30 NOTE — Anesthesia Preprocedure Evaluation (Addendum)
Anesthesia Evaluation  Patient identified by MRN, date of birth, ID band Patient awake    Reviewed: Allergy & Precautions, NPO status , Patient's Chart, lab work & pertinent test results  Airway Mallampati: II  TM Distance: >3 FB Neck ROM: Full    Dental no notable dental hx. (+) Teeth Intact, Caps   Pulmonary neg pulmonary ROS,    Pulmonary exam normal breath sounds clear to auscultation       Cardiovascular hypertension, Pt. on medications Normal cardiovascular exam Rhythm:Regular Rate:Normal     Neuro/Psych Tremor negative psych ROS   GI/Hepatic Neg liver ROS, GERD  Medicated and Controlled,  Endo/Other  Hypothyroidism Hyperlipidemia  Renal/GU negative Renal ROS  negative genitourinary   Musculoskeletal Right shoulder rotator cuff tear   Abdominal (+) + obese,   Peds  Hematology negative hematology ROS (+)   Anesthesia Other Findings   Reproductive/Obstetrics                            Anesthesia Physical Anesthesia Plan  ASA: II  Anesthesia Plan: General   Post-op Pain Management:  Regional for Post-op pain   Induction: Intravenous  PONV Risk Score and Plan: 3 and Ondansetron, Dexamethasone and Treatment may vary due to age or medical condition  Airway Management Planned: Oral ETT  Additional Equipment:   Intra-op Plan:   Post-operative Plan: Extubation in OR  Informed Consent: I have reviewed the patients History and Physical, chart, labs and discussed the procedure including the risks, benefits and alternatives for the proposed anesthesia with the patient or authorized representative who has indicated his/her understanding and acceptance.   Dental advisory given  Plan Discussed with: CRNA and Surgeon  Anesthesia Plan Comments:         Anesthesia Quick Evaluation

## 2017-11-30 NOTE — Brief Op Note (Signed)
11/30/2017  2:31 PM  PATIENT:  Brandi Richards  72 y.o. female  PRE-OPERATIVE DIAGNOSIS:  right shoulder rotator cuff tear with sever Impingement.  POST-OPERATIVE DIAGNOSIS:  right shoulder rotator cuff tear  With SEvere Impingement.  PROCEDURE:  Procedure(s): Right shoulder open acromionectomy and acromioplasty with repair of rotator cuff and DermaSpan graft (Right)  SURGEON:  Surgeon(s) and Role:    [name], MD - Primary  PHYSICIAN ASSISTANT: Ardeen Jourdain PA  ASSISTANTS: Ardeen Jourdain PA   ANESTHESIA:   general  [id number]  BLOOD ADMINISTERED:none  DRAINS: none   LOCAL MEDICATIONS USED:  OTHER Interscalene Nerve Block  SPECIMEN:  No Specimen  DISPOSITION OF SPECIMEN:  N/A  COUNTS:  YES  TOURNIQUET:  * No tourniquets in log *  DICTATION: .Other Dictation: Dictation Number [id number]  PLAN OF CARE: Admit for overnight observation  PATIENT DISPOSITION:  Stable in OR   Delay start of Pharmacological VTE agent (>24hrs) due to surgical blood loss or risk of bleeding: yes

## 2017-11-30 NOTE — Discharge Instructions (Signed)
Wear the sling at all times except when showering. The bandage is waterproof for showering only. No submerging the area in water. If the bandage becomes compromised, remove it and replace with gauze and paper tape.  No lifting or overhead movements.  No driving.  Follow up in the office in 10 days. Call Dr. Gladstone Lighter if any wound complications or temperature of 101 degrees F or over.  Call the office for an appointment to see Dr. Gladstone Lighter in two weeks: [phone] and ask for Dr. Charlestine Night nurse, Brunilda Payor.

## 2017-12-01 DIAGNOSIS — Z79899 Other long term (current) drug therapy: Secondary | ICD-10-CM | POA: Diagnosis not present

## 2017-12-01 DIAGNOSIS — E785 Hyperlipidemia, unspecified: Secondary | ICD-10-CM | POA: Diagnosis not present

## 2017-12-01 DIAGNOSIS — G2581 Restless legs syndrome: Secondary | ICD-10-CM | POA: Diagnosis not present

## 2017-12-01 DIAGNOSIS — I10 Essential (primary) hypertension: Secondary | ICD-10-CM | POA: Diagnosis not present

## 2017-12-01 DIAGNOSIS — Z79890 Hormone replacement therapy (postmenopausal): Secondary | ICD-10-CM | POA: Diagnosis not present

## 2017-12-01 DIAGNOSIS — E039 Hypothyroidism, unspecified: Secondary | ICD-10-CM | POA: Diagnosis not present

## 2017-12-01 DIAGNOSIS — M7551 Bursitis of right shoulder: Secondary | ICD-10-CM | POA: Diagnosis not present

## 2017-12-01 DIAGNOSIS — M75121 Complete rotator cuff tear or rupture of right shoulder, not specified as traumatic: Secondary | ICD-10-CM | POA: Diagnosis not present

## 2017-12-01 DIAGNOSIS — M7541 Impingement syndrome of right shoulder: Secondary | ICD-10-CM | POA: Diagnosis not present

## 2017-12-01 NOTE — Evaluation (Signed)
Occupational Therapy Evaluation Patient Details Name: Brandi Richards MRN: [id number] DOB: 09-29-1945 Today's Date: 12/01/2017    History of Present Illness s/p open acromionectomy and acromiplasty with rotator cuff repair and graft due to tear, impingement, and subacromial bursitis   Clinical Impression   This 72 year old female was admitted for the above sx. All education was completed. Pt will follow up with Dr Brandi Richards for further rehab.    Follow Up Recommendations  Follow surgeon's recommendation for DC plan and follow-up therapies    Equipment Recommendations  None recommended by OT    Recommendations for Other Services       Precautions / Restrictions Precautions Precautions: Shoulder Type of Shoulder Precautions: sling at all times x bathing/dressing.  Elbow to finger ROM; no shoulder movement Restrictions RUE Weight Bearing: Non weight bearing      Mobility Bed Mobility Overal bed mobility: Independent                Transfers                 General transfer comment: supervision    Balance                                           ADL either performed or assessed with clinical judgement   ADL Overall ADL's : Needs assistance/impaired Eating/Feeding: Set up   Grooming: Minimal assistance   Upper Body Bathing: Moderate assistance   Lower Body Bathing: Moderate assistance   Upper Body Dressing : Moderate assistance   Lower Body Dressing: Maximal assistance   Toilet Transfer: Min guard   Toileting- Clothing Manipulation and Hygiene: Set up;Sitting/lateral lean         General ADL Comments: performed toilet transfer and adl.  Educated on shoulder protocol.  Daughter present.  Handout given; both verbalize understanding. See education section of chart     Vision         Perception     Praxis      Pertinent Vitals/Pain Pain Assessment: No/denies pain     Hand Dominance Right   Extremity/Trunk  Assessment Upper Extremity Assessment Upper Extremity Assessment: RUE deficits/detail(immobilized; wrist and fingers wfls)           Communication Communication Communication: No difficulties   Cognition Arousal/Alertness: Awake/alert Behavior During Therapy: WFL for tasks assessed/performed Overall Cognitive Status: Within Functional Limits for tasks assessed                                     General Comments       Exercises     Shoulder Instructions      Home Living Family/patient expects to be discharged to:: Private residence Living Arrangements: Spouse/significant other;Children Available Help at Discharge: Family               Bathroom Shower/Tub: Teaching laboratory technician Toilet: Handicapped height         Additional Comments: daughter works in [place]. She will assist pt as well as pt's husband      Prior Functioning/Environment Level of Independence: Independent                 OT Problem List:        OT Treatment/Interventions:      OT Goals(Current goals can  be found in the care plan section) Acute Rehab OT Goals Patient Stated Goal: return to independende OT Goal Formulation: All assessment and education complete, DC therapy  OT Frequency:     Barriers to D/C:            Co-evaluation              AM-PAC PT "6 Clicks" Daily Activity     Outcome Measure Help from another person eating meals?: A Little Help from another person taking care of personal grooming?: A Little Help from another person toileting, which includes using toliet, bedpan, or urinal?: A Little Help from another person bathing (including washing, rinsing, drying)?: A Lot Help from another person to put on and taking off regular upper body clothing?: A Lot Help from another person to put on and taking off regular lower body clothing?: A Lot 6 Click Score: 15   End of Session    Activity Tolerance: No increased pain Patient left: in  bed;with call bell/phone within reach;with family/visitor present  OT Visit Diagnosis: Muscle weakness (generalized) (M62.81)                Time: 9563-8756 OT Time Calculation (min): 25 min Charges:  OT General Charges $OT Visit: 1 Visit OT Evaluation $OT Eval Low Complexity: 1 Low OT Treatments $Self Care/Home Management : 8-22 mins G-Codes:     [place], OTR/L [phone] 12/01/2017  Brandi Richards 12/01/2017, 8:25 AM

## 2017-12-01 NOTE — Progress Notes (Signed)
Subjective: 1 Day Post-Op Procedure(s) (LRB): Right shoulder open acromionectomy and acromioplasty with repair of rotator cuff and DermaSpan graft (Right) Patient reports pain as 1 on 0-10 scale.Doing fine this morning. Will DC    Objective: Vital signs in last 24 hours: Temp:  [97.6 F (36.4 C)-98.4 F (36.9 C)] 97.6 F (36.4 C) (07/04 0641) Pulse Rate:  [63-100] 63 (07/04 0641) Resp:  [14-26] 20 (07/04 0641) BP: (102-151)/(57-87) 105/57 (07/04 0641) SpO2:  [92 %-100 %] 94 % (07/04 0641) Weight:  [81.2 kg (179 lb)] 81.2 kg (179 lb) (07/03 1022)  Intake/Output from previous day: 07/03 0701 - 07/04 0700 In: 2878.8 [P.O.:600; I.V.:2072.1; IV Piggyback:206.7] Out: 1270 [Urine:1250; Blood:20] Intake/Output this shift: No intake/output data recorded.  No results for input(s): HGB in the last 72 hours. No results for input(s): WBC, RBC, HCT, PLT in the last 72 hours. No results for input(s): NA, K, CL, CO2, BUN, CREATININE, GLUCOSE, CALCIUM in the last 72 hours. No results for input(s): LABPT, INR in the last 72 hours.  Neurologically intact  Doing Well.  Assessment/Plan: 1 Day Post-Op Procedure(s) (LRB): Right shoulder open acromionectomy and acromioplasty with repair of rotator cuff and DermaSpan graft (Right) DC today.    Brandi Richards 12/01/2017, 7:04 AM

## 2017-12-02 ENCOUNTER — Encounter (HOSPITAL_COMMUNITY): Payer: Self-pay | Admitting: Orthopedic Surgery

## 2017-12-04 NOTE — Discharge Summary (Signed)
Physician Discharge Summary   Patient ID: Brandi Richards MRN: [id number] DOB/AGE: [birth year] 72 y.o.  Admit date: 11/30/2017 Discharge date: 12/04/2017  Primary Diagnosis:  Right shoulder rotator cuff tear  Admission Diagnoses:  Past Medical History:  Diagnosis Date  . Cataract   . Essential and other specified forms of tremor   . Hyperlipidemia   . Hypertension   . Movement disorder   . Nocturnal leg cramps   . Restless leg syndrome   . Thyroid disease    Discharge Diagnoses:   Active Problems:   Right rotator cuff tear arthropathy  Procedure:  Procedure(s) (LRB): Right shoulder open acromionectomy and acromioplasty with repair of rotator cuff and DermaSpan graft (Right)   Consults: None  HPI:  Shoulder Pain Patient complains of right shoulder pain. The symptoms began several months ago. Aggravating factors: no known event. Pain is located diffusely throughout the shoulder. Discomfort is described as aching and sharp/stabbing. Symptoms are exacerbated by repetitive movements, overhead movements and lying on the shoulder. Evaluation to date: MRI: abnormal tear of the rotator cuff. Therapy to date includes: rest, ice, avoidance of offending activity and home exercises which are not very effective.     Laboratory Data: Hospital Outpatient Visit on 11/24/2017  Component Date Value Ref Range Status  . aPTT 11/24/2017 32  24 - 36 seconds Final   Performed at [facility], [place] [address], [place], [place] [address]  . WBC 11/24/2017 4.4  4.0 - 10.5 K/uL Final  . RBC 11/24/2017 4.51  3.87 - 5.11 MIL/uL Final  . Hemoglobin 11/24/2017 14.9  12.0 - 15.0 g/dL Final  . HCT 11/24/2017 43.9  36.0 - 46.0 % Final  . MCV 11/24/2017 97.3  78.0 - 100.0 fL Final  . MCH 11/24/2017 33.0  26.0 - 34.0 pg Final  . MCHC 11/24/2017 33.9  30.0 - 36.0 g/dL Final  . RDW 11/24/2017 12.5  11.5 - 15.5 % Final  . Platelets 11/24/2017 225  150 - 400 K/uL Final  . Neutrophils  Relative % 11/24/2017 45  % Final  . Neutro Abs 11/24/2017 2.0  1.7 - 7.7 K/uL Final  . Lymphocytes Relative 11/24/2017 40  % Final  . Lymphs Abs 11/24/2017 1.8  0.7 - 4.0 K/uL Final  . Monocytes Relative 11/24/2017 10  % Final  . Monocytes Absolute 11/24/2017 0.4  0.1 - 1.0 K/uL Final  . Eosinophils Relative 11/24/2017 4  % Final  . Eosinophils Absolute 11/24/2017 0.2  0.0 - 0.7 K/uL Final  . Basophils Relative 11/24/2017 1  % Final  . Basophils Absolute 11/24/2017 0.0  0.0 - 0.1 K/uL Final   Performed at [facility], [place] [address], [place], [place] [address]  . Sodium 11/24/2017 139  135 - 145 mmol/L Final  . Potassium 11/24/2017 3.8  3.5 - 5.1 mmol/L Final  . Chloride 11/24/2017 104  98 - 111 mmol/L Final   Please note change in reference range.  . CO2 11/24/2017 27  22 - 32 mmol/L Final  . Glucose, Bld 11/24/2017 97  70 - 99 mg/dL Final   Please note change in reference range.  . BUN 11/24/2017 16  8 - 23 mg/dL Final   Please note change in reference range.  . Creatinine, Ser 11/24/2017 0.94  0.44 - 1.00 mg/dL Final  . Calcium 11/24/2017 9.2  8.9 - 10.3 mg/dL Final  . Total Protein 11/24/2017 7.1  6.5 - 8.1 g/dL Final  . Albumin 11/24/2017 4.1  3.5 -  5.0 g/dL Final  . AST 11/24/2017 30  15 - 41 U/L Final  . ALT 11/24/2017 28  0 - 44 U/L Final   Please note change in reference range.  . Alkaline Phosphatase 11/24/2017 59  38 - 126 U/L Final  . Total Bilirubin 11/24/2017 0.4  0.3 - 1.2 mg/dL Final  . GFR calc non Af Amer 11/24/2017 59* >60 mL/min Final  . GFR calc Af Amer 11/24/2017 >60  >60 mL/min Final   Comment: (NOTE) The eGFR has been calculated using the CKD EPI equation. This calculation has not been validated in all clinical situations. eGFR's persistently <60 mL/min signify possible Chronic Kidney Disease.   Georgiann Hahn gap 11/24/2017 8  5 - 15 Final   Performed at [facility], [place] [address] [place], [place] [address]  .  Prothrombin Time 11/24/2017 12.7  11.4 - 15.2 seconds Final  . INR 11/24/2017 0.96   Final   Performed at [facility], [place] [address], [place], [place] [address]  . ABO/RH(D) 11/24/2017 A POS   Final  . Antibody Screen 11/24/2017 NEG   Final  . Sample Expiration 11/24/2017 12/03/2017   Final  . Extend sample reason 11/24/2017    Final                   Value:NO TRANSFUSIONS OR PREGNANCY IN THE PAST 3 MONTHS Performed at [facility], [place] [address], [place], [place] [address]   . ABO/RH(D) 11/24/2017    Final                   Value:A POS Performed at [facility], [place] [address] [place], [place] [address]     EKG: Orders placed or performed during the hospital encounter of 11/24/17  . EKG 12 lead  . EKG 12 lead     Hospital Course: Patient was admitted to [facility] and taken to the OR and underwent the above state procedure without complications.  Patient tolerated the procedure well and was later transferred to the recovery room and then to the orthopaedic floor for postoperative care.  They were given PO and IV analgesics for pain control following their surgery.  They were given 24 hours of postoperative antibiotics.   OT was consulted postop to assist with ADLs.  The patient was allowed to be WBAT with therapy and was taught how to use the sling. Discharge planning was consulted to help with postop disposition and equipment needs.  Patient had a good night on the evening of surgery and started to get up OOB with therapy on day one. Patient was seen in rounds and was ready to go home on day one.  They were given discharge instructions and dressing directions.  They were instructed on when to follow up in the office with Dr. Gladstone Lighter.   Diet: Cardiac diet Activity:Wear sling at all times Follow-up:in 10 days Disposition - Home Discharged Condition: stable   Discharge Instructions    Call MD / Call 911   Complete  by:  As directed    If you experience chest pain or shortness of breath, CALL 911 and be transported to the hospital emergency room.  If you develope a fever above 101 F, pus (white drainage) or increased drainage or redness at the wound, or calf pain, call your surgeon's office.   Constipation Prevention   Complete by:  As directed    Drink plenty of fluids.  Prune juice may be helpful.  You may use a stool softener, such as Colace (over the counter) 100 mg twice a day.  Use MiraLax (over the counter) for constipation as needed.   Diet - low sodium heart healthy   Complete by:  As directed    Discharge instructions   Complete by:  As directed    Keep your sling on at all times, including sleeping in your sling. Bandage is waterproof. Call Dr. Gladstone Lighter if any wound complications or temperature of 101 degrees F or over.  Call the office for an appointment to see Dr. Gladstone Lighter in ten days: [phone] and ask for Dr. Charlestine Night nurse, Brunilda Payor.   Driving restrictions   Complete by:  As directed    No driving   Increase activity slowly as tolerated   Complete by:  As directed      Allergies as of 12/01/2017      Reactions   Morphine And Related Nausea And Vomiting   After shoulder surgery   Primidone Nausea And Vomiting   Sulfonamide Derivatives Hives, Rash      Medication List    TAKE these medications   ALPRAZolam 0.25 MG tablet Commonly known as:  XANAX Take 1 tablet (0.25 mg total) by mouth 3 (three) times daily as needed.   aspirin 81 MG tablet Take 81 mg by mouth at bedtime.   Fish Oil 1000 MG Caps Take 1,000 mg by mouth at bedtime.   Flaxseed Oil 1000 MG Caps Take 1,000 mg by mouth at bedtime.   hydrochlorothiazide 25 MG tablet Commonly known as:  HYDRODIURIL Take 25 mg by mouth daily.   levothyroxine 112 MCG tablet Commonly known as:  SYNTHROID, LEVOTHROID Take 112 mcg by mouth daily before breakfast.   lisinopril 20 MG tablet Commonly known as:   PRINIVIL,ZESTRIL Take 20 mg by mouth at bedtime.   multivitamin with minerals Tabs tablet Take 1 tablet by mouth daily.   naproxen sodium 220 MG tablet Commonly known as:  ALEVE Take 220 mg by mouth at bedtime.   omeprazole 20 MG capsule Commonly known as:  PRILOSEC Take 20 mg by mouth daily before breakfast.   oxyCODONE 5 MG immediate release tablet Commonly known as:  Oxy IR/ROXICODONE Take 1 tablet (5 mg total) by mouth every 4 (four) hours as needed for moderate pain (pain score 4-6).   pravastatin 40 MG tablet Commonly known as:  PRAVACHOL Take 40 mg by mouth at bedtime.   vitamin B-12 1000 MCG tablet Commonly known as:  CYANOCOBALAMIN Take 1,000 mcg by mouth daily.   Vitamin D3 1000 units Caps Take 1,000 Units by mouth at bedtime.      Follow-up Information    [name], MD. Schedule an appointment as soon as possible for a visit in 10 day(s).   Specialty:  Orthopedic Surgery Contact information: [address] [place] [address] [phone]           Signed: Ardeen Jourdain, PA-C Orthopaedic Surgery 12/04/2017, 5:11 PM

## 2017-12-06 ENCOUNTER — Encounter (HOSPITAL_COMMUNITY): Payer: Self-pay | Admitting: Orthopedic Surgery

## 2017-12-08 DIAGNOSIS — Z79899 Other long term (current) drug therapy: Secondary | ICD-10-CM | POA: Diagnosis not present

## 2017-12-08 DIAGNOSIS — E7800 Pure hypercholesterolemia, unspecified: Secondary | ICD-10-CM | POA: Diagnosis not present

## 2017-12-13 ENCOUNTER — Other Ambulatory Visit: Payer: Self-pay | Admitting: Family Medicine

## 2017-12-13 DIAGNOSIS — Z1231 Encounter for screening mammogram for malignant neoplasm of breast: Secondary | ICD-10-CM

## 2018-01-19 DIAGNOSIS — M25511 Pain in right shoulder: Secondary | ICD-10-CM | POA: Diagnosis not present

## 2018-01-26 ENCOUNTER — Ambulatory Visit
Admission: RE | Admit: 2018-01-26 | Discharge: 2018-01-26 | Disposition: A | Discharge status: Home / Self Care | Payer: Medicare HMO | Source: Ambulatory Visit | Attending: Family Medicine | Admitting: Family Medicine

## 2018-01-26 ENCOUNTER — Ambulatory Visit: Payer: Medicare HMO

## 2018-01-26 DIAGNOSIS — Z1231 Encounter for screening mammogram for malignant neoplasm of breast: Secondary | ICD-10-CM | POA: Diagnosis not present

## 2018-03-29 DIAGNOSIS — K219 Gastro-esophageal reflux disease without esophagitis: Secondary | ICD-10-CM | POA: Diagnosis not present

## 2018-03-29 DIAGNOSIS — Z23 Encounter for immunization: Secondary | ICD-10-CM | POA: Diagnosis not present

## 2018-03-29 DIAGNOSIS — E7800 Pure hypercholesterolemia, unspecified: Secondary | ICD-10-CM | POA: Diagnosis not present

## 2018-03-29 DIAGNOSIS — E039 Hypothyroidism, unspecified: Secondary | ICD-10-CM | POA: Diagnosis not present

## 2018-03-29 DIAGNOSIS — I10 Essential (primary) hypertension: Secondary | ICD-10-CM | POA: Diagnosis not present

## 2018-06-06 DIAGNOSIS — H01022 Squamous blepharitis right lower eyelid: Secondary | ICD-10-CM | POA: Diagnosis not present

## 2018-06-06 DIAGNOSIS — H00024 Hordeolum internum left upper eyelid: Secondary | ICD-10-CM | POA: Diagnosis not present

## 2018-06-06 DIAGNOSIS — H01025 Squamous blepharitis left lower eyelid: Secondary | ICD-10-CM | POA: Diagnosis not present

## 2018-06-06 DIAGNOSIS — H43813 Vitreous degeneration, bilateral: Secondary | ICD-10-CM | POA: Diagnosis not present

## 2018-06-06 DIAGNOSIS — H01021 Squamous blepharitis right upper eyelid: Secondary | ICD-10-CM | POA: Diagnosis not present

## 2018-06-06 DIAGNOSIS — Z961 Presence of intraocular lens: Secondary | ICD-10-CM | POA: Diagnosis not present

## 2018-06-06 DIAGNOSIS — H04123 Dry eye syndrome of bilateral lacrimal glands: Secondary | ICD-10-CM | POA: Diagnosis not present

## 2018-06-06 DIAGNOSIS — H01024 Squamous blepharitis left upper eyelid: Secondary | ICD-10-CM | POA: Diagnosis not present

## 2018-06-12 DIAGNOSIS — H00024 Hordeolum internum left upper eyelid: Secondary | ICD-10-CM | POA: Diagnosis not present

## 2018-10-02 DIAGNOSIS — M25562 Pain in left knee: Secondary | ICD-10-CM | POA: Diagnosis not present

## 2018-10-12 DIAGNOSIS — K219 Gastro-esophageal reflux disease without esophagitis: Secondary | ICD-10-CM | POA: Diagnosis not present

## 2018-10-12 DIAGNOSIS — J45909 Unspecified asthma, uncomplicated: Secondary | ICD-10-CM | POA: Diagnosis not present

## 2018-10-12 DIAGNOSIS — Z1389 Encounter for screening for other disorder: Secondary | ICD-10-CM | POA: Diagnosis not present

## 2018-10-12 DIAGNOSIS — Z0000 Encounter for general adult medical examination without abnormal findings: Secondary | ICD-10-CM | POA: Diagnosis not present

## 2018-10-12 DIAGNOSIS — I10 Essential (primary) hypertension: Secondary | ICD-10-CM | POA: Diagnosis not present

## 2018-10-12 DIAGNOSIS — E039 Hypothyroidism, unspecified: Secondary | ICD-10-CM | POA: Diagnosis not present

## 2018-10-12 DIAGNOSIS — E7800 Pure hypercholesterolemia, unspecified: Secondary | ICD-10-CM | POA: Diagnosis not present

## 2018-10-13 DIAGNOSIS — E039 Hypothyroidism, unspecified: Secondary | ICD-10-CM | POA: Diagnosis not present

## 2018-10-13 DIAGNOSIS — I10 Essential (primary) hypertension: Secondary | ICD-10-CM | POA: Diagnosis not present

## 2018-10-13 DIAGNOSIS — E7800 Pure hypercholesterolemia, unspecified: Secondary | ICD-10-CM | POA: Diagnosis not present

## 2018-10-19 DIAGNOSIS — I10 Essential (primary) hypertension: Secondary | ICD-10-CM | POA: Diagnosis not present

## 2018-10-26 DIAGNOSIS — M25562 Pain in left knee: Secondary | ICD-10-CM | POA: Diagnosis not present

## 2018-10-26 DIAGNOSIS — R252 Cramp and spasm: Secondary | ICD-10-CM | POA: Diagnosis not present

## 2018-12-27 ENCOUNTER — Other Ambulatory Visit: Payer: Self-pay | Admitting: Family Medicine

## 2018-12-27 DIAGNOSIS — Z1231 Encounter for screening mammogram for malignant neoplasm of breast: Secondary | ICD-10-CM

## 2019-01-23 DIAGNOSIS — Z23 Encounter for immunization: Secondary | ICD-10-CM | POA: Diagnosis not present

## 2019-02-01 DIAGNOSIS — M25562 Pain in left knee: Secondary | ICD-10-CM | POA: Diagnosis not present

## 2019-02-13 ENCOUNTER — Ambulatory Visit
Admission: RE | Admit: 2019-02-13 | Discharge: 2019-02-13 | Disposition: A | Discharge status: Home / Self Care | Payer: Medicare HMO | Source: Ambulatory Visit | Attending: Family Medicine | Admitting: Family Medicine

## 2019-02-13 ENCOUNTER — Other Ambulatory Visit: Payer: Self-pay

## 2019-02-13 DIAGNOSIS — Z1231 Encounter for screening mammogram for malignant neoplasm of breast: Secondary | ICD-10-CM | POA: Diagnosis not present

## 2019-04-16 DIAGNOSIS — I10 Essential (primary) hypertension: Secondary | ICD-10-CM | POA: Diagnosis not present

## 2019-04-16 DIAGNOSIS — E7800 Pure hypercholesterolemia, unspecified: Secondary | ICD-10-CM | POA: Diagnosis not present

## 2019-04-16 DIAGNOSIS — E039 Hypothyroidism, unspecified: Secondary | ICD-10-CM | POA: Diagnosis not present

## 2019-04-16 DIAGNOSIS — K219 Gastro-esophageal reflux disease without esophagitis: Secondary | ICD-10-CM | POA: Diagnosis not present

## 2019-04-16 DIAGNOSIS — M2550 Pain in unspecified joint: Secondary | ICD-10-CM | POA: Diagnosis not present

## 2019-05-08 DIAGNOSIS — M25561 Pain in right knee: Secondary | ICD-10-CM | POA: Diagnosis not present

## 2019-05-08 DIAGNOSIS — M25562 Pain in left knee: Secondary | ICD-10-CM | POA: Diagnosis not present

## 2019-05-09 DIAGNOSIS — M2550 Pain in unspecified joint: Secondary | ICD-10-CM | POA: Diagnosis not present

## 2019-05-09 DIAGNOSIS — I10 Essential (primary) hypertension: Secondary | ICD-10-CM | POA: Diagnosis not present

## 2019-05-30 DIAGNOSIS — E7800 Pure hypercholesterolemia, unspecified: Secondary | ICD-10-CM | POA: Diagnosis not present

## 2019-05-30 DIAGNOSIS — I10 Essential (primary) hypertension: Secondary | ICD-10-CM | POA: Diagnosis not present

## 2019-05-30 DIAGNOSIS — J45909 Unspecified asthma, uncomplicated: Secondary | ICD-10-CM | POA: Diagnosis not present

## 2019-05-30 DIAGNOSIS — E039 Hypothyroidism, unspecified: Secondary | ICD-10-CM | POA: Diagnosis not present

## 2019-07-10 DIAGNOSIS — J45909 Unspecified asthma, uncomplicated: Secondary | ICD-10-CM | POA: Diagnosis not present

## 2019-07-10 DIAGNOSIS — I10 Essential (primary) hypertension: Secondary | ICD-10-CM | POA: Diagnosis not present

## 2019-07-10 DIAGNOSIS — E7800 Pure hypercholesterolemia, unspecified: Secondary | ICD-10-CM | POA: Diagnosis not present

## 2019-07-10 DIAGNOSIS — E039 Hypothyroidism, unspecified: Secondary | ICD-10-CM | POA: Diagnosis not present

## 2019-08-17 DIAGNOSIS — E7800 Pure hypercholesterolemia, unspecified: Secondary | ICD-10-CM | POA: Diagnosis not present

## 2019-08-17 DIAGNOSIS — J45909 Unspecified asthma, uncomplicated: Secondary | ICD-10-CM | POA: Diagnosis not present

## 2019-08-17 DIAGNOSIS — I10 Essential (primary) hypertension: Secondary | ICD-10-CM | POA: Diagnosis not present

## 2019-08-17 DIAGNOSIS — E039 Hypothyroidism, unspecified: Secondary | ICD-10-CM | POA: Diagnosis not present

## 2019-08-27 DIAGNOSIS — M25562 Pain in left knee: Secondary | ICD-10-CM | POA: Diagnosis not present

## 2019-08-27 DIAGNOSIS — M79671 Pain in right foot: Secondary | ICD-10-CM | POA: Diagnosis not present

## 2019-08-27 DIAGNOSIS — M25561 Pain in right knee: Secondary | ICD-10-CM | POA: Diagnosis not present

## 2019-08-27 DIAGNOSIS — M170 Bilateral primary osteoarthritis of knee: Secondary | ICD-10-CM | POA: Diagnosis not present

## 2019-10-12 DIAGNOSIS — M25561 Pain in right knee: Secondary | ICD-10-CM | POA: Diagnosis not present

## 2019-10-12 DIAGNOSIS — M170 Bilateral primary osteoarthritis of knee: Secondary | ICD-10-CM | POA: Diagnosis not present

## 2019-10-12 DIAGNOSIS — M25562 Pain in left knee: Secondary | ICD-10-CM | POA: Diagnosis not present

## 2019-10-17 DIAGNOSIS — Z0000 Encounter for general adult medical examination without abnormal findings: Secondary | ICD-10-CM | POA: Diagnosis not present

## 2019-10-17 DIAGNOSIS — G4700 Insomnia, unspecified: Secondary | ICD-10-CM | POA: Diagnosis not present

## 2019-10-17 DIAGNOSIS — E2839 Other primary ovarian failure: Secondary | ICD-10-CM | POA: Diagnosis not present

## 2019-10-17 DIAGNOSIS — K219 Gastro-esophageal reflux disease without esophagitis: Secondary | ICD-10-CM | POA: Diagnosis not present

## 2019-10-17 DIAGNOSIS — M1710 Unilateral primary osteoarthritis, unspecified knee: Secondary | ICD-10-CM | POA: Diagnosis not present

## 2019-10-17 DIAGNOSIS — I10 Essential (primary) hypertension: Secondary | ICD-10-CM | POA: Diagnosis not present

## 2019-10-17 DIAGNOSIS — E039 Hypothyroidism, unspecified: Secondary | ICD-10-CM | POA: Diagnosis not present

## 2019-10-17 DIAGNOSIS — Z1389 Encounter for screening for other disorder: Secondary | ICD-10-CM | POA: Diagnosis not present

## 2019-10-17 DIAGNOSIS — E7800 Pure hypercholesterolemia, unspecified: Secondary | ICD-10-CM | POA: Diagnosis not present

## 2019-10-19 DIAGNOSIS — M25562 Pain in left knee: Secondary | ICD-10-CM | POA: Diagnosis not present

## 2019-10-19 DIAGNOSIS — M25561 Pain in right knee: Secondary | ICD-10-CM | POA: Diagnosis not present

## 2019-10-19 DIAGNOSIS — M170 Bilateral primary osteoarthritis of knee: Secondary | ICD-10-CM | POA: Diagnosis not present

## 2019-10-22 ENCOUNTER — Other Ambulatory Visit: Payer: Self-pay | Admitting: Family Medicine

## 2019-10-22 DIAGNOSIS — Z1231 Encounter for screening mammogram for malignant neoplasm of breast: Secondary | ICD-10-CM

## 2019-10-22 DIAGNOSIS — E2839 Other primary ovarian failure: Secondary | ICD-10-CM

## 2019-10-26 DIAGNOSIS — M170 Bilateral primary osteoarthritis of knee: Secondary | ICD-10-CM | POA: Diagnosis not present

## 2019-10-26 DIAGNOSIS — M25562 Pain in left knee: Secondary | ICD-10-CM | POA: Diagnosis not present

## 2019-10-26 DIAGNOSIS — M25561 Pain in right knee: Secondary | ICD-10-CM | POA: Diagnosis not present

## 2019-10-31 ENCOUNTER — Encounter: Payer: Self-pay | Admitting: Gastroenterology

## 2019-11-13 DIAGNOSIS — M25562 Pain in left knee: Secondary | ICD-10-CM | POA: Diagnosis not present

## 2019-12-06 ENCOUNTER — Ambulatory Visit (AMBULATORY_SURGERY_CENTER): Payer: Self-pay

## 2019-12-06 ENCOUNTER — Other Ambulatory Visit: Payer: Self-pay

## 2019-12-06 VITALS — Ht 64.0 in | Wt 188.2 lb

## 2019-12-06 DIAGNOSIS — Z86010 Personal history of colonic polyps: Secondary | ICD-10-CM

## 2019-12-06 MED ORDER — SUTAB 1479-225-188 MG PO TABS: 1.0000 | ORAL_TABLET | ORAL | 0 refills | Status: DC

## 2019-12-06 NOTE — Progress Notes (Signed)
No egg or soy allergy known to patient  No issues with past sedation with any surgeries  or procedures, no intubation problems  No diet pills per patient No home 02 use per patient  No blood thinners per patient  Pt denies issues with constipation  No A fib or A flutter   COVID 19 guidelines implemented in PV today   COVID vaccine completed on 07/2019  Coupon given to patient at pre visit  Due to the COVID-19 pandemic we are asking patients to follow these guidelines. Please only bring one care partner. Please be aware that your care partner may wait in the car in the parking lot or if they feel like they will be too hot to wait in the car, they may wait in the lobby on the 4th floor. All care partners are required to wear a mask the entire time (we do not have any that we can provide them), they need to practice social distancing, and we will do a Covid check for all patient's and care partners when you arrive. Also we will check their temperature and your temperature. If the care partner waits in their car they need to stay in the parking lot the entire time and we will call them on their cell phone when the patient is ready for discharge so they can bring the car to the front of the building. Also all patient's will need to wear a mask into building.

## 2019-12-07 DIAGNOSIS — M1712 Unilateral primary osteoarthritis, left knee: Secondary | ICD-10-CM | POA: Diagnosis not present

## 2019-12-20 ENCOUNTER — Ambulatory Visit (AMBULATORY_SURGERY_CENTER): Payer: Medicare HMO | Admitting: Gastroenterology

## 2019-12-20 ENCOUNTER — Encounter: Payer: Self-pay | Admitting: Gastroenterology

## 2019-12-20 ENCOUNTER — Other Ambulatory Visit: Payer: Self-pay

## 2019-12-20 VITALS — BP 119/77 | HR 69 | Temp 97.3°F | Resp 17 | Ht 64.0 in | Wt 188.2 lb

## 2019-12-20 DIAGNOSIS — Z1211 Encounter for screening for malignant neoplasm of colon: Secondary | ICD-10-CM | POA: Diagnosis not present

## 2019-12-20 DIAGNOSIS — K635 Polyp of colon: Secondary | ICD-10-CM | POA: Diagnosis not present

## 2019-12-20 DIAGNOSIS — D122 Benign neoplasm of ascending colon: Secondary | ICD-10-CM | POA: Diagnosis not present

## 2019-12-20 DIAGNOSIS — D123 Benign neoplasm of transverse colon: Secondary | ICD-10-CM

## 2019-12-20 DIAGNOSIS — D124 Benign neoplasm of descending colon: Secondary | ICD-10-CM

## 2019-12-20 DIAGNOSIS — D128 Benign neoplasm of rectum: Secondary | ICD-10-CM | POA: Diagnosis not present

## 2019-12-20 DIAGNOSIS — Z86010 Personal history of colonic polyps: Secondary | ICD-10-CM

## 2019-12-20 MED ORDER — SODIUM CHLORIDE 0.9 % IV SOLN: 500.0000 mL | Freq: Once | INTRAVENOUS | Status: DC

## 2019-12-20 NOTE — Patient Instructions (Signed)
Handout on polyps given.   YOU HAD AN ENDOSCOPIC PROCEDURE TODAY AT THE [facility]:   Refer to the procedure report that was given to you for any specific questions about what was found during the examination.  If the procedure report does not answer your questions, please call your gastroenterologist to clarify.  If you requested that your care partner not be given the details of your procedure findings, then the procedure report has been included in a sealed envelope for you to review at your convenience later.  YOU SHOULD EXPECT: Some feelings of bloating in the abdomen. Passage of more gas than usual.  Walking can help get rid of the air that was put into your GI tract during the procedure and reduce the bloating. If you had a lower endoscopy (such as a colonoscopy or flexible sigmoidoscopy) you may notice spotting of blood in your stool or on the toilet paper. If you underwent a bowel prep for your procedure, you may not have a normal bowel movement for a few days.  Please Note:  You might notice some irritation and congestion in your nose or some drainage.  This is from the oxygen used during your procedure.  There is no need for concern and it should clear up in a day or so.  SYMPTOMS TO REPORT IMMEDIATELY:   Following lower endoscopy (colonoscopy or flexible sigmoidoscopy):  Excessive amounts of blood in the stool  Significant tenderness or worsening of abdominal pains  Swelling of the abdomen that is new, acute  Fever of 100F or higher   Following upper endoscopy (EGD)  Vomiting of blood or coffee ground material  New chest pain or pain under the shoulder blades  Painful or persistently difficult swallowing  New shortness of breath  Fever of 100F or higher  Black, tarry-looking stools  For urgent or emergent issues, a gastroenterologist can be reached at any hour by calling [phone]. Do not use MyChart messaging for urgent concerns.    DIET:  We do  recommend a small meal at first, but then you may proceed to your regular diet.  Drink plenty of fluids but you should avoid alcoholic beverages for 24 hours.  ACTIVITY:  You should plan to take it easy for the rest of today and you should NOT DRIVE or use heavy machinery until tomorrow (because of the sedation medicines used during the test).    FOLLOW UP: Our staff will call the number listed on your records 48-72 hours following your procedure to check on you and address any questions or concerns that you may have regarding the information given to you following your procedure. If we do not reach you, we will leave a message.  We will attempt to reach you two times.  During this call, we will ask if you have developed any symptoms of COVID 19. If you develop any symptoms (ie: fever, flu-like symptoms, shortness of breath, cough etc.) before then, please call [phone].  If you test positive for Covid 19 in the 2 weeks post procedure, please call and report this information to us.    If any biopsies were taken you will be contacted by phone or by letter within the next 1-3 weeks.  Please call us at [phone] if you have not heard about the biopsies in 3 weeks.    SIGNATURES/CONFIDENTIALITY: You and/or your care partner have signed paperwork which will be entered into your electronic medical record.  These signatures attest to the fact   that that the information above on your After Visit Summary has been reviewed and is understood.  Full responsibility of the confidentiality of this discharge information lies with you and/or your care-partner. 

## 2019-12-20 NOTE — Progress Notes (Signed)
Called to room to assist during endoscopic procedure.  Patient ID and intended procedure confirmed with present staff. Received instructions for my participation in the procedure from the performing physician.  

## 2019-12-20 NOTE — Progress Notes (Signed)
VS by CW  Pt's states no medical or surgical changes since previsit or office visit.  

## 2019-12-20 NOTE — Op Note (Signed)
[facility] Patient Name: Brandi Richards Procedure Date: 12/20/2019 9:21 AM MRN: [id number] Endoscopist: Mallie Mussel L. Loletha Carrow , MD Age: 74 Referring MD:  Date of Birth: 13-Aug-1945 Gender: Female Account #: [id number] Procedure:                Colonoscopy Indications:              Surveillance: Personal history of adenomatous                            polyps on last colonoscopy 3 years ago (TA x 7,                            including rectal polyp with focal HGD; 10/2015, poor                            prep--------no polyps better prep 10/2016) Medicines:                Monitored Anesthesia Care Procedure:                Pre-Anesthesia Assessment:                           - Prior to the procedure, a History and Physical                            was performed, and patient medications and                            allergies were reviewed. The patient's tolerance of                            previous anesthesia was also reviewed. The risks                            and benefits of the procedure and the sedation                            options and risks were discussed with the patient.                            All questions were answered, and informed consent                            was obtained. Prior Anticoagulants: The patient has                            taken no previous anticoagulant or antiplatelet                            agents except for aspirin. ASA Grade Assessment: II                            - A patient with mild systemic disease. After  reviewing the risks and benefits, the patient was                            deemed in satisfactory condition to undergo the                            procedure.                           After obtaining informed consent, the colonoscope                            was passed under direct vision. Throughout the                            procedure, the patient's blood pressure, pulse, and                             oxygen saturations were monitored continuously. The                            Colonoscope was introduced through the anus and                            advanced to the the cecum, identified by                            appendiceal orifice and ileocecal valve. The                            colonoscopy was extremely difficult due to a                            redundant colon and significant looping. Successful                            completion of the procedure was aided by changing                            the patient to a supine position, changing the                            patient to a prone position and using manual                            pressure. The patient tolerated the procedure well.                            The quality of the bowel preparation was good. The                            ileocecal valve, appendiceal orifice, and rectum  were photographed. The bowel preparation used was                            Sutab. Scope In: 9:25:45 AM Scope Out: 9:58:34 AM Scope Withdrawal Time: 0 hours 18 minutes 34 seconds  Total Procedure Duration: 0 hours 32 minutes 49 seconds  Findings:                 The perianal and digital rectal examinations were                            normal.                           The colon (entire examined portion) was                            significantly redundant.                           Five sessile polyps were found in the rectum,                            descending colon, transverse colon and ascending                            colon. The polyps were 3 to 5 mm in size. These                            polyps were removed with a cold snare. Resection                            and retrieval were complete.                           The exam was otherwise without abnormality on                            direct and retroflexion views. Complications:            No immediate  complications. Estimated Blood Loss:     Estimated blood loss was minimal. Impression:               - Redundant colon.                           - Five 3 to 5 mm polyps in the rectum, in the                            descending colon, in the transverse colon and in                            the ascending colon, removed with a cold snare.                            Resected and retrieved. (rectal polyp possible HP  by apearance)                           - The examination was otherwise normal on direct                            and retroflexion views. Recommendation:           - Patient has a contact number available for                            emergencies. The signs and symptoms of potential                            delayed complications were discussed with the                            patient. Return to normal activities tomorrow.                            Written discharge instructions were provided to the                            patient.                           - Resume previous diet.                           - Continue present medications.                           - Await pathology results.                           - Repeat colonoscopy is recommended for                            surveillance. The colonoscopy date will be                            determined after pathology results from today's                            exam become available for review. 2 DAY  PREP FOR                            NEXT EXAM Brandi Richards L. Loletha Carrow, MD 12/20/2019 10:08:50 AM This report has been signed electronically.

## 2019-12-20 NOTE — Progress Notes (Signed)
Pt Drowsy. VSS. To PACU, report to RN. No anesthetic complications noted.  

## 2019-12-24 ENCOUNTER — Telehealth: Payer: Self-pay

## 2019-12-24 NOTE — Telephone Encounter (Signed)
  Follow up Call-  Call back number 12/20/2019  Post procedure Call Back phone  # [phone]  Permission to leave phone message Yes  Some recent data might be hidden     Patient questions:  Do you have a fever, pain , or abdominal swelling? No. Pain Score  0 *  Have you tolerated food without any problems? Yes.    Have you been able to return to your normal activities? Yes.    Do you have any questions about your discharge instructions: Diet   No. Medications  No. Follow up visit  No.  Do you have questions or concerns about your Care? No.  Actions: * If pain score is 4 or above: No action needed, pain <4. 1. Have you developed a fever since your procedure? no  2.   Have you had an respiratory symptoms (SOB or cough) since your procedure? no  3.   Have you tested positive for COVID 19 since your procedure no  4.   Have you had any family members/close contacts diagnosed with the COVID 19 since your procedure?  no   If yes to any of these questions please route to Joylene John, RN and Erenest Rasher, RN

## 2019-12-25 ENCOUNTER — Encounter: Payer: Self-pay | Admitting: Gastroenterology

## 2019-12-26 DIAGNOSIS — M1710 Unilateral primary osteoarthritis, unspecified knee: Secondary | ICD-10-CM | POA: Diagnosis not present

## 2019-12-26 DIAGNOSIS — E039 Hypothyroidism, unspecified: Secondary | ICD-10-CM | POA: Diagnosis not present

## 2019-12-26 DIAGNOSIS — E7800 Pure hypercholesterolemia, unspecified: Secondary | ICD-10-CM | POA: Diagnosis not present

## 2019-12-26 DIAGNOSIS — J45909 Unspecified asthma, uncomplicated: Secondary | ICD-10-CM | POA: Diagnosis not present

## 2019-12-26 DIAGNOSIS — I10 Essential (primary) hypertension: Secondary | ICD-10-CM | POA: Diagnosis not present

## 2020-01-02 ENCOUNTER — Other Ambulatory Visit: Payer: Self-pay

## 2020-01-02 ENCOUNTER — Ambulatory Visit
Admission: RE | Admit: 2020-01-02 | Discharge: 2020-01-02 | Disposition: A | Discharge status: Home / Self Care | Payer: Medicare HMO | Source: Ambulatory Visit | Attending: Family Medicine | Admitting: Family Medicine

## 2020-01-02 DIAGNOSIS — E2839 Other primary ovarian failure: Secondary | ICD-10-CM

## 2020-01-02 DIAGNOSIS — M85852 Other specified disorders of bone density and structure, left thigh: Secondary | ICD-10-CM | POA: Diagnosis not present

## 2020-01-02 DIAGNOSIS — Z780 Asymptomatic menopausal state: Secondary | ICD-10-CM | POA: Diagnosis not present

## 2020-02-14 ENCOUNTER — Other Ambulatory Visit: Payer: Self-pay

## 2020-02-14 ENCOUNTER — Ambulatory Visit
Admission: RE | Admit: 2020-02-14 | Discharge: 2020-02-14 | Disposition: A | Discharge status: Home / Self Care | Payer: Medicare HMO | Source: Ambulatory Visit | Attending: Family Medicine | Admitting: Family Medicine

## 2020-02-14 DIAGNOSIS — Z1231 Encounter for screening mammogram for malignant neoplasm of breast: Secondary | ICD-10-CM

## 2020-03-24 DIAGNOSIS — I10 Essential (primary) hypertension: Secondary | ICD-10-CM | POA: Diagnosis not present

## 2020-03-24 DIAGNOSIS — E039 Hypothyroidism, unspecified: Secondary | ICD-10-CM | POA: Diagnosis not present

## 2020-03-24 DIAGNOSIS — E7800 Pure hypercholesterolemia, unspecified: Secondary | ICD-10-CM | POA: Diagnosis not present

## 2020-03-24 DIAGNOSIS — J45909 Unspecified asthma, uncomplicated: Secondary | ICD-10-CM | POA: Diagnosis not present

## 2020-03-24 DIAGNOSIS — M1710 Unilateral primary osteoarthritis, unspecified knee: Secondary | ICD-10-CM | POA: Diagnosis not present

## 2020-04-14 DIAGNOSIS — E7800 Pure hypercholesterolemia, unspecified: Secondary | ICD-10-CM | POA: Diagnosis not present

## 2020-04-14 DIAGNOSIS — I10 Essential (primary) hypertension: Secondary | ICD-10-CM | POA: Diagnosis not present

## 2020-04-14 DIAGNOSIS — E039 Hypothyroidism, unspecified: Secondary | ICD-10-CM | POA: Diagnosis not present

## 2020-04-14 DIAGNOSIS — K219 Gastro-esophageal reflux disease without esophagitis: Secondary | ICD-10-CM | POA: Diagnosis not present

## 2020-05-08 DIAGNOSIS — M25562 Pain in left knee: Secondary | ICD-10-CM | POA: Diagnosis not present

## 2020-05-08 DIAGNOSIS — M25561 Pain in right knee: Secondary | ICD-10-CM | POA: Diagnosis not present

## 2020-05-15 DIAGNOSIS — R748 Abnormal levels of other serum enzymes: Secondary | ICD-10-CM | POA: Diagnosis not present

## 2020-06-23 DIAGNOSIS — K219 Gastro-esophageal reflux disease without esophagitis: Secondary | ICD-10-CM | POA: Diagnosis not present

## 2020-06-23 DIAGNOSIS — G4700 Insomnia, unspecified: Secondary | ICD-10-CM | POA: Diagnosis not present

## 2020-06-23 DIAGNOSIS — J45909 Unspecified asthma, uncomplicated: Secondary | ICD-10-CM | POA: Diagnosis not present

## 2020-06-23 DIAGNOSIS — M1710 Unilateral primary osteoarthritis, unspecified knee: Secondary | ICD-10-CM | POA: Diagnosis not present

## 2020-06-23 DIAGNOSIS — I10 Essential (primary) hypertension: Secondary | ICD-10-CM | POA: Diagnosis not present

## 2020-06-23 DIAGNOSIS — E7800 Pure hypercholesterolemia, unspecified: Secondary | ICD-10-CM | POA: Diagnosis not present

## 2020-06-23 DIAGNOSIS — E039 Hypothyroidism, unspecified: Secondary | ICD-10-CM | POA: Diagnosis not present

## 2020-07-10 DIAGNOSIS — R748 Abnormal levels of other serum enzymes: Secondary | ICD-10-CM | POA: Diagnosis not present

## 2020-08-12 DIAGNOSIS — M1710 Unilateral primary osteoarthritis, unspecified knee: Secondary | ICD-10-CM | POA: Diagnosis not present

## 2020-08-12 DIAGNOSIS — J45909 Unspecified asthma, uncomplicated: Secondary | ICD-10-CM | POA: Diagnosis not present

## 2020-08-12 DIAGNOSIS — E7800 Pure hypercholesterolemia, unspecified: Secondary | ICD-10-CM | POA: Diagnosis not present

## 2020-08-12 DIAGNOSIS — E039 Hypothyroidism, unspecified: Secondary | ICD-10-CM | POA: Diagnosis not present

## 2020-08-12 DIAGNOSIS — K219 Gastro-esophageal reflux disease without esophagitis: Secondary | ICD-10-CM | POA: Diagnosis not present

## 2020-08-12 DIAGNOSIS — I10 Essential (primary) hypertension: Secondary | ICD-10-CM | POA: Diagnosis not present

## 2020-08-12 DIAGNOSIS — G4700 Insomnia, unspecified: Secondary | ICD-10-CM | POA: Diagnosis not present

## 2020-08-21 DIAGNOSIS — L309 Dermatitis, unspecified: Secondary | ICD-10-CM | POA: Diagnosis not present

## 2020-09-06 ENCOUNTER — Ambulatory Visit
Admission: EM | Admit: 2020-09-06 | Discharge: 2020-09-06 | Disposition: A | Discharge status: Home / Self Care | Payer: Medicare HMO | Attending: Family Medicine | Admitting: Family Medicine

## 2020-09-06 ENCOUNTER — Other Ambulatory Visit: Payer: Self-pay

## 2020-09-06 DIAGNOSIS — J0100 Acute maxillary sinusitis, unspecified: Secondary | ICD-10-CM | POA: Diagnosis not present

## 2020-09-06 DIAGNOSIS — R000 Tachycardia, unspecified: Secondary | ICD-10-CM

## 2020-09-06 MED ORDER — AMOXICILLIN-POT CLAVULANATE 875-125 MG PO TABS: 1.0000 | ORAL_TABLET | Freq: Two times a day (BID) | ORAL | 0 refills | Status: DC

## 2020-09-06 NOTE — Discharge Instructions (Signed)
Begin the antibiotic prescribed for a likely sinus infection. Do you best to ensure you are drinking enough fluids. Your heart rate was slightly elevated today. Your ECG did not show any worrisome signs. You may return here in a few days for a recheck.  Avoid any over the counter medications that contain pseudoephedrine or phenylephrine.

## 2020-09-06 NOTE — ED Triage Notes (Signed)
Pt presents with c/o nasal congestion that developed last Saturday

## 2020-09-06 NOTE — ED Provider Notes (Signed)
[facility]   [id number] 09/06/20 Arrival Time: 1856  ASSESSMENT & PLAN:  1. Acute non-recurrent maxillary sinusitis   2. Tachycardia      Discharge Instructions     Begin the antibiotic prescribed for a likely sinus infection. Do you best to ensure you are drinking enough fluids. Your heart rate was slightly elevated today. Your ECG did not show any worrisome signs. You may return here in a few days for a recheck.  Avoid any over the counter medications that contain pseudoephedrine or phenylephrine.     Begin: Meds ordered this encounter  Medications  . amoxicillin-clavulanate (AUGMENTIN) 875-125 MG tablet    Sig: Take 1 tablet by mouth every 12 (twelve) hours.    Dispense:  20 tablet    Refill:  0   Discussed OTC medicines that could cause increased HR.  ECG reviewed by me: Sinus tachycardia. No STEMI.  Reviewed expectations re: course of current medical issues. Questions answered. Outlined signs and symptoms indicating need for more acute intervention. Patient verbalized understanding. After Visit Summary given.   SUBJECTIVE: History from: patient.  Brandi Richards is a 75 y.o. female who presents with complaint of nasal congestion, post-nasal drainage, and sinus pain. Onset gradual, > 1 w ago. Respiratory symptoms: none. Fever: absent. Overall normal PO intake without n/v. OTC treatment: decongestants without much relief. Seasonal allergies: "maybe". History of frequent sinus infections: "occasional". No specific aggravating or alleviating factors reported.  Social History   Tobacco Use  Smoking Status Never Smoker  Smokeless Tobacco Never Used    OBJECTIVE:  Vitals:   09/06/20 0852  BP: 113/72  Pulse: (!) 126  Resp: (!) 22  Temp: 98 F (36.7 C)  SpO2: 94%    Tachycardia noted; regular. Recheck RR: 18 General appearance: alert; no distress HEENT: nasal congestion; clear runny nose; throat irritation secondary to post-nasal drainage;  R-sided maxillary tenderness to palpation; turbinates boggy Neck: supple without LAD; trachea midline Lungs: unlabored respirations, symmetrical air entry; cough: absent; no respiratory distress Skin: warm and dry Psychological: alert and cooperative; normal mood and affect  Allergies  Allergen Reactions  . Morphine And Related Nausea And Vomiting    After shoulder surgery  . Primidone Nausea And Vomiting  . Sulfonamide Derivatives Hives and Rash    Past Medical History:  Diagnosis Date  . Arthritis   . Cataract   . Essential and other specified forms of tremor   . Hyperlipidemia   . Hypertension   . Movement disorder   . Nocturnal leg cramps   . Osteopenia   . Restless leg syndrome   . Thyroid disease    Family History  Adopted: Yes  Problem Relation Age of Onset  . Lung cancer Father   . Thyroid disease Sister   . Macular degeneration Sister   . Osteoporosis Sister   . Breast cancer Neg Hx    Social History   Socioeconomic History  . Marital status: Married    Spouse name: archie  . Number of children: 4  . Years of education: 16  . Highest education level: Not on file  Occupational History  . Occupation: retired  Tobacco Use  . Smoking status: Never Smoker  . Smokeless tobacco: Never Used  Vaping Use  . Vaping Use: Never used  Substance and Sexual Activity  . Alcohol use: No    Alcohol/week: 0.0 standard drinks  . Drug use: No  . Sexual activity: Not on file  Other Topics Concern  .  Not on file  Social History Narrative   Patient is currently trying to stop drinking caffeine.   Patient is right handed.   Social Determinants of Health   Financial Resource Strain: Not on file  Food Insecurity: Not on file  Transportation Needs: Not on file  Physical Activity: Not on file  Stress: Not on file  Social Connections: Not on file  Intimate Partner Violence: Not on file            Vanessa Kick, MD 09/08/20 [phone]

## 2020-10-02 DIAGNOSIS — M25562 Pain in left knee: Secondary | ICD-10-CM | POA: Diagnosis not present

## 2020-10-02 DIAGNOSIS — M25561 Pain in right knee: Secondary | ICD-10-CM | POA: Diagnosis not present

## 2020-10-29 DIAGNOSIS — E7800 Pure hypercholesterolemia, unspecified: Secondary | ICD-10-CM | POA: Diagnosis not present

## 2020-10-29 DIAGNOSIS — M1710 Unilateral primary osteoarthritis, unspecified knee: Secondary | ICD-10-CM | POA: Diagnosis not present

## 2020-10-29 DIAGNOSIS — I10 Essential (primary) hypertension: Secondary | ICD-10-CM | POA: Diagnosis not present

## 2020-10-29 DIAGNOSIS — Z1159 Encounter for screening for other viral diseases: Secondary | ICD-10-CM | POA: Diagnosis not present

## 2020-10-29 DIAGNOSIS — Z1389 Encounter for screening for other disorder: Secondary | ICD-10-CM | POA: Diagnosis not present

## 2020-10-29 DIAGNOSIS — Z0000 Encounter for general adult medical examination without abnormal findings: Secondary | ICD-10-CM | POA: Diagnosis not present

## 2020-10-29 DIAGNOSIS — K219 Gastro-esophageal reflux disease without esophagitis: Secondary | ICD-10-CM | POA: Diagnosis not present

## 2020-10-29 DIAGNOSIS — E039 Hypothyroidism, unspecified: Secondary | ICD-10-CM | POA: Diagnosis not present

## 2020-11-26 DIAGNOSIS — K219 Gastro-esophageal reflux disease without esophagitis: Secondary | ICD-10-CM | POA: Diagnosis not present

## 2020-11-26 DIAGNOSIS — I10 Essential (primary) hypertension: Secondary | ICD-10-CM | POA: Diagnosis not present

## 2020-11-26 DIAGNOSIS — J45909 Unspecified asthma, uncomplicated: Secondary | ICD-10-CM | POA: Diagnosis not present

## 2020-11-26 DIAGNOSIS — E7800 Pure hypercholesterolemia, unspecified: Secondary | ICD-10-CM | POA: Diagnosis not present

## 2020-11-26 DIAGNOSIS — G4700 Insomnia, unspecified: Secondary | ICD-10-CM | POA: Diagnosis not present

## 2020-11-26 DIAGNOSIS — E039 Hypothyroidism, unspecified: Secondary | ICD-10-CM | POA: Diagnosis not present

## 2020-11-26 DIAGNOSIS — M1710 Unilateral primary osteoarthritis, unspecified knee: Secondary | ICD-10-CM | POA: Diagnosis not present

## 2020-12-15 DIAGNOSIS — J45909 Unspecified asthma, uncomplicated: Secondary | ICD-10-CM | POA: Diagnosis not present

## 2020-12-15 DIAGNOSIS — M1710 Unilateral primary osteoarthritis, unspecified knee: Secondary | ICD-10-CM | POA: Diagnosis not present

## 2020-12-15 DIAGNOSIS — I10 Essential (primary) hypertension: Secondary | ICD-10-CM | POA: Diagnosis not present

## 2020-12-15 DIAGNOSIS — K219 Gastro-esophageal reflux disease without esophagitis: Secondary | ICD-10-CM | POA: Diagnosis not present

## 2020-12-15 DIAGNOSIS — E7800 Pure hypercholesterolemia, unspecified: Secondary | ICD-10-CM | POA: Diagnosis not present

## 2020-12-15 DIAGNOSIS — G4700 Insomnia, unspecified: Secondary | ICD-10-CM | POA: Diagnosis not present

## 2020-12-15 DIAGNOSIS — E039 Hypothyroidism, unspecified: Secondary | ICD-10-CM | POA: Diagnosis not present

## 2020-12-31 DIAGNOSIS — D225 Melanocytic nevi of trunk: Secondary | ICD-10-CM | POA: Diagnosis not present

## 2020-12-31 DIAGNOSIS — L720 Epidermal cyst: Secondary | ICD-10-CM | POA: Diagnosis not present

## 2020-12-31 DIAGNOSIS — L728 Other follicular cysts of the skin and subcutaneous tissue: Secondary | ICD-10-CM | POA: Diagnosis not present

## 2020-12-31 DIAGNOSIS — X32XXXA Exposure to sunlight, initial encounter: Secondary | ICD-10-CM | POA: Diagnosis not present

## 2020-12-31 DIAGNOSIS — L570 Actinic keratosis: Secondary | ICD-10-CM | POA: Diagnosis not present

## 2021-01-01 ENCOUNTER — Other Ambulatory Visit: Payer: Self-pay | Admitting: Family Medicine

## 2021-01-01 DIAGNOSIS — Z1231 Encounter for screening mammogram for malignant neoplasm of breast: Secondary | ICD-10-CM

## 2021-01-13 DIAGNOSIS — G4700 Insomnia, unspecified: Secondary | ICD-10-CM | POA: Diagnosis not present

## 2021-01-13 DIAGNOSIS — E039 Hypothyroidism, unspecified: Secondary | ICD-10-CM | POA: Diagnosis not present

## 2021-01-13 DIAGNOSIS — J45909 Unspecified asthma, uncomplicated: Secondary | ICD-10-CM | POA: Diagnosis not present

## 2021-01-13 DIAGNOSIS — K219 Gastro-esophageal reflux disease without esophagitis: Secondary | ICD-10-CM | POA: Diagnosis not present

## 2021-01-13 DIAGNOSIS — M1710 Unilateral primary osteoarthritis, unspecified knee: Secondary | ICD-10-CM | POA: Diagnosis not present

## 2021-01-13 DIAGNOSIS — I10 Essential (primary) hypertension: Secondary | ICD-10-CM | POA: Diagnosis not present

## 2021-01-13 DIAGNOSIS — E7800 Pure hypercholesterolemia, unspecified: Secondary | ICD-10-CM | POA: Diagnosis not present

## 2021-02-12 DIAGNOSIS — H0102A Squamous blepharitis right eye, upper and lower eyelids: Secondary | ICD-10-CM | POA: Diagnosis not present

## 2021-02-12 DIAGNOSIS — H0102B Squamous blepharitis left eye, upper and lower eyelids: Secondary | ICD-10-CM | POA: Diagnosis not present

## 2021-02-12 DIAGNOSIS — H43813 Vitreous degeneration, bilateral: Secondary | ICD-10-CM | POA: Diagnosis not present

## 2021-02-12 DIAGNOSIS — Z961 Presence of intraocular lens: Secondary | ICD-10-CM | POA: Diagnosis not present

## 2021-02-12 DIAGNOSIS — H04123 Dry eye syndrome of bilateral lacrimal glands: Secondary | ICD-10-CM | POA: Diagnosis not present

## 2021-02-13 DIAGNOSIS — M545 Low back pain, unspecified: Secondary | ICD-10-CM | POA: Diagnosis not present

## 2021-02-13 DIAGNOSIS — M25561 Pain in right knee: Secondary | ICD-10-CM | POA: Diagnosis not present

## 2021-02-13 DIAGNOSIS — M25562 Pain in left knee: Secondary | ICD-10-CM | POA: Diagnosis not present

## 2021-02-23 ENCOUNTER — Ambulatory Visit
Admission: RE | Admit: 2021-02-23 | Discharge: 2021-02-23 | Disposition: A | Discharge status: Home / Self Care | Payer: Medicare HMO | Source: Ambulatory Visit | Attending: Family Medicine | Admitting: Family Medicine

## 2021-02-23 ENCOUNTER — Other Ambulatory Visit: Payer: Self-pay

## 2021-02-23 DIAGNOSIS — Z1231 Encounter for screening mammogram for malignant neoplasm of breast: Secondary | ICD-10-CM

## 2021-05-11 DIAGNOSIS — K219 Gastro-esophageal reflux disease without esophagitis: Secondary | ICD-10-CM | POA: Diagnosis not present

## 2021-05-11 DIAGNOSIS — E7800 Pure hypercholesterolemia, unspecified: Secondary | ICD-10-CM | POA: Diagnosis not present

## 2021-05-11 DIAGNOSIS — M1990 Unspecified osteoarthritis, unspecified site: Secondary | ICD-10-CM | POA: Diagnosis not present

## 2021-05-11 DIAGNOSIS — I10 Essential (primary) hypertension: Secondary | ICD-10-CM | POA: Diagnosis not present

## 2021-05-11 DIAGNOSIS — R748 Abnormal levels of other serum enzymes: Secondary | ICD-10-CM | POA: Diagnosis not present

## 2021-05-11 DIAGNOSIS — E039 Hypothyroidism, unspecified: Secondary | ICD-10-CM | POA: Diagnosis not present

## 2021-05-13 ENCOUNTER — Other Ambulatory Visit: Payer: Self-pay | Admitting: Family Medicine

## 2021-05-13 DIAGNOSIS — R748 Abnormal levels of other serum enzymes: Secondary | ICD-10-CM

## 2021-05-21 DIAGNOSIS — M545 Low back pain, unspecified: Secondary | ICD-10-CM | POA: Diagnosis not present

## 2021-05-21 DIAGNOSIS — M25562 Pain in left knee: Secondary | ICD-10-CM | POA: Diagnosis not present

## 2021-05-21 DIAGNOSIS — M25561 Pain in right knee: Secondary | ICD-10-CM | POA: Diagnosis not present

## 2021-05-21 DIAGNOSIS — M25551 Pain in right hip: Secondary | ICD-10-CM | POA: Diagnosis not present

## 2021-06-04 ENCOUNTER — Ambulatory Visit
Admission: RE | Admit: 2021-06-04 | Discharge: 2021-06-04 | Disposition: A | Discharge status: Home / Self Care | Payer: Medicare HMO | Source: Ambulatory Visit | Attending: Family Medicine | Admitting: Family Medicine

## 2021-06-04 DIAGNOSIS — R748 Abnormal levels of other serum enzymes: Secondary | ICD-10-CM

## 2021-06-04 DIAGNOSIS — R945 Abnormal results of liver function studies: Secondary | ICD-10-CM | POA: Diagnosis not present

## 2021-07-01 DIAGNOSIS — L570 Actinic keratosis: Secondary | ICD-10-CM | POA: Diagnosis not present

## 2021-07-01 DIAGNOSIS — X32XXXD Exposure to sunlight, subsequent encounter: Secondary | ICD-10-CM | POA: Diagnosis not present

## 2021-07-01 DIAGNOSIS — G250 Essential tremor: Secondary | ICD-10-CM | POA: Diagnosis not present

## 2021-07-10 DIAGNOSIS — M25561 Pain in right knee: Secondary | ICD-10-CM | POA: Diagnosis not present

## 2021-07-10 DIAGNOSIS — M79672 Pain in left foot: Secondary | ICD-10-CM | POA: Diagnosis not present

## 2021-07-10 DIAGNOSIS — M545 Low back pain, unspecified: Secondary | ICD-10-CM | POA: Diagnosis not present

## 2021-07-10 DIAGNOSIS — M25562 Pain in left knee: Secondary | ICD-10-CM | POA: Diagnosis not present

## 2021-07-22 DIAGNOSIS — R635 Abnormal weight gain: Secondary | ICD-10-CM | POA: Diagnosis not present

## 2021-07-22 DIAGNOSIS — G250 Essential tremor: Secondary | ICD-10-CM | POA: Diagnosis not present

## 2021-08-17 ENCOUNTER — Ambulatory Visit
Admission: EM | Admit: 2021-08-17 | Discharge: 2021-08-17 | Disposition: A | Discharge status: Home / Self Care | Payer: Medicare HMO | Attending: Internal Medicine | Admitting: Internal Medicine

## 2021-08-17 ENCOUNTER — Encounter: Payer: Self-pay | Admitting: Emergency Medicine

## 2021-08-17 ENCOUNTER — Ambulatory Visit (INDEPENDENT_AMBULATORY_CARE_PROVIDER_SITE_OTHER): Payer: Medicare HMO

## 2021-08-17 ENCOUNTER — Other Ambulatory Visit: Payer: Self-pay

## 2021-08-17 DIAGNOSIS — J069 Acute upper respiratory infection, unspecified: Secondary | ICD-10-CM | POA: Diagnosis not present

## 2021-08-17 DIAGNOSIS — R059 Cough, unspecified: Secondary | ICD-10-CM

## 2021-08-17 MED ORDER — PREDNISONE 20 MG PO TABS: 40.0000 mg | ORAL_TABLET | Freq: Every day | ORAL | 0 refills | Status: AC

## 2021-08-17 MED ORDER — BENZONATATE 100 MG PO CAPS: 100.0000 mg | ORAL_CAPSULE | Freq: Three times a day (TID) | ORAL | 0 refills | Status: AC | PRN

## 2021-08-17 NOTE — Discharge Instructions (Signed)
Your chest x-ray was normal.  I suspect you have a viral upper respiratory infection that should self resolve in the next few days with symptomatic treatment.  You have been prescribed 2 medications to alleviate symptoms.  Follow-up if symptoms persist or worsen. ?

## 2021-08-17 NOTE — ED Triage Notes (Signed)
Coughing x 8 days, keeping awake at night ?

## 2021-08-17 NOTE — ED Provider Notes (Addendum)
[name]    CSN: [id number] Arrival date & time: 08/17/21  0805      History   Chief Complaint Chief Complaint  Patient presents with   Cough    HPI Brandi Richards is a 76 y.o. female.   Patient presents with nasal congestion, sneezing, cough that has been present for approximately 7 days.  Denies any known fevers.  Patient reports that her entire family has similar symptoms.  Patient has taken Zyrtec and over-the-counter cold and flu medications with minimal improvement in symptoms.  Denies chest pain, shortness of breath, sore throat, nausea, vomiting, diarrhea, abdominal pain.   Cough  Past Medical History:  Diagnosis Date   Arthritis    Cataract    Essential and other specified forms of tremor    Hyperlipidemia    Hypertension    Movement disorder    Nocturnal leg cramps    Osteopenia    Restless leg syndrome    Thyroid disease     Patient Active Problem List   Diagnosis Date Noted   Right rotator cuff tear arthropathy 11/30/2017   Tremor, essential 06/05/2015   Ptosis of eyelid 12/17/2013   Essential and other specified forms of tremor 09/19/2012   Cramp of limb 09/19/2012   Restless legs syndrome (RLS) 09/19/2012    Past Surgical History:  Procedure Laterality Date   BACK SURGERY     CATARACT EXTRACTION     Bil   CHOLECYSTECTOMY     COLONOSCOPY     2018   NASAL SINUS SURGERY     PARTIAL HYSTERECTOMY     PLANTAR FASCIECTOMY Left    SHOULDER OPEN ROTATOR CUFF REPAIR Right 11/30/2017   Procedure: Right shoulder open acromionectomy and acromioplasty with repair of rotator cuff and DermaSpan graft;  Surgeon: Ranee Gosselin, MD;  Location: [facility];  Service: Orthopedics;  Laterality: Right;   SHOULDER SURGERY Left    TONSILLECTOMY     TUBAL LIGATION      OB History   No obstetric history on file.      Home Medications    Prior to Admission medications   Medication Sig Start Date End Date Taking? Authorizing Provider   benzonatate (TESSALON) 100 MG capsule Take 1 capsule (100 mg total) by mouth every 8 (eight) hours as needed for cough. 08/17/21  Yes Mckinzi Eriksen, Rolly Salter E, FNP  predniSONE (DELTASONE) 20 MG tablet Take 2 tablets (40 mg total) by mouth daily for 5 days. 08/17/21 08/22/21 Yes Geriann Lafont, Acie Fredrickson, FNP  ALPRAZolam Prudy Feeler) 0.25 MG tablet Take 1 tablet (0.25 mg total) by mouth 3 (three) times daily as needed. 01/27/15   York Spaniel, MD  amoxicillin-clavulanate (AUGMENTIN) 875-125 MG tablet Take 1 tablet by mouth every 12 (twelve) hours. 09/06/20   Mardella Layman, MD  aspirin 81 MG tablet Take 81 mg by mouth at bedtime.     [provider]  Cholecalciferol (VITAMIN D3) 1000 units CAPS Take 1,000 Units by mouth at bedtime.     [provider]  hydrochlorothiazide (HYDRODIURIL) 25 MG tablet Take 25 mg by mouth daily.     [provider]  levothyroxine (SYNTHROID, LEVOTHROID) 112 MCG tablet Take 112 mcg by mouth daily before breakfast.    [provider]  lisinopril (PRINIVIL,ZESTRIL) 20 MG tablet Take 20 mg by mouth at bedtime.    [provider]  Multiple Vitamin (MULTIVITAMIN WITH MINERALS) TABS tablet Take 1 tablet by mouth daily.    [provider]  Omega-3  Fatty Acids (FISH OIL) 1000 MG CAPS Take 1,000 mg by mouth at bedtime.     [provider]  omeprazole (PRILOSEC) 20 MG capsule Take 20 mg by mouth daily before breakfast. 09/27/17   [provider]  pravastatin (PRAVACHOL) 40 MG tablet Take 40 mg by mouth at bedtime.     [provider]  vitamin B-12 (CYANOCOBALAMIN) 1000 MCG tablet Take 1,000 mcg by mouth daily. Patient not taking: Reported on 12/20/2019    [provider]    Family History Family History  Adopted: Yes  Problem Relation Age of Onset   Lung cancer Father    Thyroid disease Sister    Macular degeneration Sister    Osteoporosis Sister    Breast cancer Neg Hx     Social History Social History    Tobacco Use   Smoking status: Never   Smokeless tobacco: Never  Vaping Use   Vaping Use: Never used  Substance Use Topics   Alcohol use: No    Alcohol/week: 0.0 standard drinks   Drug use: No     Allergies   Morphine and related, Primidone, and Sulfonamide derivatives   Review of Systems Review of Systems Per HPI  Physical Exam Triage Vital Signs ED Triage Vitals  Enc Vitals Group     BP 08/17/21 0819 131/83     Pulse Rate 08/17/21 0819 (!) 108     Resp 08/17/21 0819 18     Temp 08/17/21 0819 97.9 F (36.6 C)     Temp Source 08/17/21 0819 Axillary     SpO2 08/17/21 0819 95 %     Weight --      Height --      Head Circumference --      Peak Flow --      Pain Score 08/17/21 0820 0     Pain Loc --      Pain Edu? --      Excl. in GC? --    No data found.  Updated Vital Signs BP 131/83 (BP Location: Left Arm)   Pulse (!) 108   Temp 97.9 F (36.6 C) (Axillary) Comment: Just drank water, would not take orally  Resp 18   SpO2 95%   Visual Acuity Right Eye Distance:   Left Eye Distance:   Bilateral Distance:    Right Eye Near:   Left Eye Near:    Bilateral Near:     Physical Exam Constitutional:      General: She is not in acute distress.    Appearance: Normal appearance. She is not toxic-appearing or diaphoretic.  HENT:     Head: Normocephalic and atraumatic.     Right Ear: Tympanic membrane and ear canal normal.     Left Ear: Tympanic membrane and ear canal normal.     Nose: Congestion present.     Mouth/Throat:     Mouth: Mucous membranes are moist.     Pharynx: No posterior oropharyngeal erythema.  Eyes:     Extraocular Movements: Extraocular movements intact.     Conjunctiva/sclera: Conjunctivae normal.     Pupils: Pupils are equal, round, and reactive to light.  Cardiovascular:     Rate and Rhythm: Normal rate and regular rhythm.     Pulses: Normal pulses.     Heart sounds: Normal heart sounds.  Pulmonary:     Effort: Pulmonary effort  is normal. No respiratory distress.     Breath sounds: No stridor. Rhonchi present. No wheezing or rales.  Abdominal:     General: Abdomen is flat. Bowel sounds are normal.     Palpations: Abdomen is soft.  Musculoskeletal:        General: Normal range of motion.     Cervical back: Normal range of motion.  Skin:    General: Skin is warm and dry.  Neurological:     General: No focal deficit present.     Mental Status: She is alert and oriented to person, place, and time. Mental status is at baseline.  Psychiatric:        Mood and Affect: Mood normal.        Behavior: Behavior normal.     UC Treatments / Results  Labs (all labs ordered are listed, but only abnormal results are displayed) Labs Reviewed - No data to display  EKG   Radiology DG Chest 2 View  Result Date: 08/17/2021 CLINICAL DATA:  Cough in a 76 year old female. EXAM: CHEST - 2 VIEW COMPARISON:  June 02, 2004. FINDINGS: Trachea midline.  Cardiomediastinal contours are stable. Hilar structures are stable. No lobar consolidative changes. No sign of pleural effusion. Minimal scarring or atelectasis at the LEFT lung base. No visible pneumothorax. On limited assessment there is no acute skeletal finding. IMPRESSION: No active cardiopulmonary disease. Electronically Signed   By: Donzetta Kohut M.D.   On: 08/17/2021 08:37    Procedures Procedures (including critical care time)  Medications Ordered in UC Medications - No data to display  Initial Impression / Assessment and Plan / UC Course  I have reviewed the triage vital signs and the nursing notes.  Pertinent labs & imaging results that were available during my care of the patient were reviewed by me and considered in my medical decision making (see chart for details).     Chest x-ray was negative for any acute cardiopulmonary process.  Suspect viral cause to patient's symptoms given patient's close exposure to similar illness as well as physical exam.  Also  suspect viral acute bronchitis given harsh cough on exam.  Will opt to treat with prednisone steroid x5 days to decrease inflammation associated with harsh cough as patient has previously tolerated this well as well as benzonatate to take as needed for cough.  Do not think that any antibiotics are necessary at this time as it appears viral and chest x-ray is normal.  Discussed strict return and ER precautions.  Patient verbalized understanding and was agreeable with plan. Final Clinical Impressions(s) / UC Diagnoses   Final diagnoses:  Viral upper respiratory tract infection with cough     Discharge Instructions      Your chest x-ray was normal.  I suspect you have a viral upper respiratory infection that should self resolve in the next few days with symptomatic treatment.  You have been prescribed 2 medications to alleviate symptoms.  Follow-up if symptoms persist or worsen.     ED Prescriptions     Medication Sig Dispense Auth. Provider   predniSONE (DELTASONE) 20 MG tablet Take 2 tablets (40 mg total) by mouth daily for 5 days. 10 tablet [place], [place] E, [place]   benzonatate (TESSALON) 100 MG capsule Take 1 capsule (100 mg total) by mouth every 8 (eight) hours as needed for cough. 21 capsule [place], Acie Fredrickson, [place]      PDMP not reviewed this encounter.   Gustavus Bryant, [place] 08/17/21 0859    Gustavus Bryant, [place] 08/17/21 [phone]

## 2021-08-24 ENCOUNTER — Encounter: Payer: Self-pay | Admitting: Emergency Medicine

## 2021-08-24 ENCOUNTER — Ambulatory Visit
Admission: EM | Admit: 2021-08-24 | Discharge: 2021-08-24 | Disposition: A | Discharge status: Home / Self Care | Payer: Medicare HMO | Attending: Physician Assistant | Admitting: Physician Assistant

## 2021-08-24 ENCOUNTER — Other Ambulatory Visit: Payer: Self-pay

## 2021-08-24 DIAGNOSIS — J019 Acute sinusitis, unspecified: Secondary | ICD-10-CM

## 2021-08-24 MED ORDER — AMOXICILLIN-POT CLAVULANATE 875-125 MG PO TABS: 1.0000 | ORAL_TABLET | Freq: Two times a day (BID) | ORAL | 0 refills | Status: AC

## 2021-08-24 MED ORDER — PROMETHAZINE-DM 6.25-15 MG/5ML PO SYRP: 5.0000 mL | ORAL_SOLUTION | Freq: Four times a day (QID) | ORAL | 0 refills | Status: AC | PRN

## 2021-08-24 NOTE — ED Provider Notes (Signed)
?[facility] ? ? ? ?CSN: [id number] ?Arrival date & time: 08/24/21  0800 ? ? ?  ? ?History   ?Chief Complaint ?Chief Complaint  ?Patient presents with  ? Cough  ? ? ?HPI ?Brandi Richards is a 76 y.o. female.  ? ?Patient here today for evaluation of sinus congestion, cough, frontal and maxillary TTP that started about 2 weeks ago. She has had some cough as well. She states last night she developed chills and fever she believes. She has not had any vomiting or diarrhea. She has tried the [employer] previously prescribed without improvement.  ? ?The history is provided by the patient.  ? ?Past Medical History:  ?Diagnosis Date  ? Arthritis   ? Cataract   ? Essential and other specified forms of tremor   ? Hyperlipidemia   ? Hypertension   ? Movement disorder   ? Nocturnal leg cramps   ? Osteopenia   ? Restless leg syndrome   ? Thyroid disease   ? ? ?Patient Active Problem List  ? Diagnosis Date Noted  ? Right rotator cuff tear arthropathy 11/30/2017  ? Tremor, essential 06/05/2015  ? Ptosis of eyelid 12/17/2013  ? Essential and other specified forms of tremor 09/19/2012  ? Cramp of limb 09/19/2012  ? Restless legs syndrome (RLS) 09/19/2012  ? ? ?Past Surgical History:  ?Procedure Laterality Date  ? BACK SURGERY    ? CATARACT EXTRACTION    ? Bil  ? CHOLECYSTECTOMY    ? COLONOSCOPY    ? 2018  ? NASAL SINUS SURGERY    ? PARTIAL HYSTERECTOMY    ? PLANTAR FASCIECTOMY Left   ? SHOULDER OPEN ROTATOR CUFF REPAIR Right 11/30/2017  ? Procedure: Right shoulder open acromionectomy and acromioplasty with repair of rotator cuff and DermaSpan graft;  Surgeon: [name], MD;  Location: [facility];  Service: Orthopedics;  Laterality: Right;  ? SHOULDER SURGERY Left   ? TONSILLECTOMY    ? TUBAL LIGATION    ? ? ?OB History   ?No obstetric history on file. ?  ? ? ? ?Home Medications   ? ?Prior to Admission medications   ?Medication Sig Start Date End Date Taking? Authorizing Provider  ?amoxicillin-clavulanate (AUGMENTIN)  875-125 MG tablet Take 1 tablet by mouth every 12 (twelve) hours. 08/24/21  Yes Francene Finders, PA-C  ?promethazine-dextromethorphan (PROMETHAZINE-DM) 6.25-15 MG/5ML syrup Take 5 mLs by mouth 4 (four) times daily as needed for cough. 08/24/21  Yes Francene Finders, PA-C  ?ALPRAZolam (XANAX) 0.25 MG tablet Take 1 tablet (0.25 mg total) by mouth 3 (three) times daily as needed. 01/27/15   Kathrynn Ducking, MD  ?aspirin 81 MG tablet Take 81 mg by mouth at bedtime.     [provider]  ?benzonatate (TESSALON) 100 MG capsule Take 1 capsule (100 mg total) by mouth every 8 (eight) hours as needed for cough. 08/17/21   Teodora Medici, FNP  ?Cholecalciferol (VITAMIN D3) 1000 units CAPS Take 1,000 Units by mouth at bedtime.     [provider]  ?hydrochlorothiazide (HYDRODIURIL) 25 MG tablet Take 25 mg by mouth daily.     [provider]  ?levothyroxine (SYNTHROID, LEVOTHROID) 112 MCG tablet Take 112 mcg by mouth daily before breakfast.    [provider]  ?lisinopril (PRINIVIL,ZESTRIL) 20 MG tablet Take 20 mg by mouth at bedtime.    [provider]  ?Multiple Vitamin (MULTIVITAMIN WITH MINERALS) TABS tablet Take 1 tablet by mouth daily.    [provider]  ?Omega-3 Fatty Acids (FISH OIL) 1000 MG CAPS Take 1,000 mg by mouth at bedtime.     [provider]  ?omeprazole (PRILOSEC) 20 MG capsule Take 20 mg by mouth daily before breakfast. 09/27/17   [provider]  ?pravastatin (PRAVACHOL) 40 MG tablet Take 40 mg by mouth at bedtime.     [provider]  ?vitamin B-12 (CYANOCOBALAMIN) 1000 MCG tablet Take 1,000 mcg by mouth daily. ?Patient not taking: Reported on 12/20/2019    [provider]  ? ? ?Family History ?Family History  ?Adopted: Yes  ?Problem Relation Age of Onset  ? Lung cancer Father   ? Thyroid disease Sister   ? Macular degeneration Sister   ? Osteoporosis Sister   ? Breast cancer Neg Hx   ? ? ?Social History ?Social History   ? ?Tobacco Use  ? Smoking status: Never  ? Smokeless tobacco: Never  ?Vaping Use  ? Vaping Use: Never used  ?Substance Use Topics  ? Alcohol use: No  ?  Alcohol/week: 0.0 standard drinks  ? Drug use: No  ? ? ? ?Allergies   ?Morphine and related, Primidone, and Sulfonamide derivatives ? ? ?Review of Systems ?Review of Systems  ?Constitutional:  Positive for chills and fever (subjective).  ?HENT:  Positive for congestion and sinus pressure. Negative for ear pain.   ?Eyes:  Negative for discharge and redness.  ?Respiratory:  Positive for cough. Negative for shortness of breath and wheezing.   ?Gastrointestinal:  Negative for abdominal pain, diarrhea, nausea and vomiting.  ? ? ?Physical Exam ?Triage Vital Signs ?ED Triage Vitals  ?Enc Vitals Group  ?   BP   ?   Pulse   ?   Resp   ?   Temp   ?   Temp src   ?   SpO2   ?   Weight   ?   Height   ?   Head Circumference   ?   Peak Flow   ?   Pain Score   ?   Pain Loc   ?   Pain Edu?   ?   Excl. in [place]?   ? ?No data found. ? ?Updated Vital Signs ?BP 132/84 (BP Location: Left Arm)   Pulse (!) 107   Temp 97.8 ?F (36.6 ?C) (Oral)   Resp 18   SpO2 95%  ? ?Physical Exam ?Vitals and nursing note reviewed.  ?Constitutional:   ?   General: She is not in acute distress. ?   Appearance: Normal appearance. She is not ill-appearing.  ?HENT:  ?   Head: Normocephalic and atraumatic.  ?   Nose: Congestion present.  ?Eyes:  ?   Conjunctiva/sclera: Conjunctivae normal.  ?Cardiovascular:  ?   Rate and Rhythm: Regular rhythm. Tachycardia present.  ?   Heart sounds: Normal heart sounds. No murmur heard. ?   Comments: Mildly tachycardic ?Pulmonary:  ?   Effort: Pulmonary effort is normal. No respiratory distress.  ?   Breath sounds: Normal breath sounds. No wheezing, rhonchi or rales.  ?Skin: ?   General: Skin is warm and dry.  ?Neurological:  ?   Mental Status: She is alert.  ?Psychiatric:     ?   Mood and Affect: Mood normal.     ?   Thought Content: Thought content normal.  ? ? ? ?UC  Treatments / Results  ?Labs ?(all labs ordered are listed, but only abnormal results are displayed) ?Labs Reviewed - No  data to display ? ?EKG ? ? ?Radiology ?No results found. ? ?Procedures ?Procedures (including critical care time) ? ?Medications Ordered in UC ?Medications - No data to display ? ?Initial Impression / Assessment and Plan / UC Course  ?I have reviewed the triage vital signs and the nursing notes. ? ?Pertinent labs & imaging results that were available during my care of the patient were reviewed by me and considered in my medical decision making (see chart for details). ? ?  ?Will treat to cover sinusitis with augmentin and cough syrup prescribed. Recommended follow up if no improvement or if symptoms worsen in any way.  ? ?Final Clinical Impressions(s) / UC Diagnoses  ? ?Final diagnoses:  ?Acute sinusitis, recurrence not specified, unspecified location  ? ?Discharge Instructions   ?None ?  ? ?ED Prescriptions   ? ? Medication Sig Dispense Auth. Provider  ? amoxicillin-clavulanate (AUGMENTIN) 875-125 MG tablet Take 1 tablet by mouth every 12 (twelve) hours. 14 tablet Ewell Poe F, PA-C  ? promethazine-dextromethorphan (PROMETHAZINE-DM) 6.25-15 MG/5ML syrup Take 5 mLs by mouth 4 (four) times daily as needed for cough. 118 mL Francene Finders, PA-C  ? ?  ? ?PDMP not reviewed this encounter. ?  ?Francene Finders, PA-C ?08/24/21 [phone] ? ?

## 2021-08-24 NOTE — ED Triage Notes (Signed)
Productive cough with green and yellow mucus, nasal congestion, frontal and maxillary sinus tenderness to palpation. Patient reports new chills/fever last night. Seen here for same recently ?

## 2021-09-10 DIAGNOSIS — G7249 Other inflammatory and immune myopathies, not elsewhere classified: Secondary | ICD-10-CM | POA: Diagnosis not present

## 2021-09-10 DIAGNOSIS — M545 Low back pain, unspecified: Secondary | ICD-10-CM | POA: Diagnosis not present

## 2021-09-14 DIAGNOSIS — G7249 Other inflammatory and immune myopathies, not elsewhere classified: Secondary | ICD-10-CM | POA: Diagnosis not present

## 2021-10-01 DIAGNOSIS — M25561 Pain in right knee: Secondary | ICD-10-CM | POA: Diagnosis not present

## 2021-10-25 DIAGNOSIS — M25561 Pain in right knee: Secondary | ICD-10-CM | POA: Diagnosis not present

## 2021-10-30 DIAGNOSIS — M25561 Pain in right knee: Secondary | ICD-10-CM | POA: Diagnosis not present

## 2021-11-23 DIAGNOSIS — K219 Gastro-esophageal reflux disease without esophagitis: Secondary | ICD-10-CM | POA: Diagnosis not present

## 2021-11-23 DIAGNOSIS — I10 Essential (primary) hypertension: Secondary | ICD-10-CM | POA: Diagnosis not present

## 2021-11-23 DIAGNOSIS — G250 Essential tremor: Secondary | ICD-10-CM | POA: Diagnosis not present

## 2021-11-23 DIAGNOSIS — E039 Hypothyroidism, unspecified: Secondary | ICD-10-CM | POA: Diagnosis not present

## 2021-11-23 DIAGNOSIS — Z0000 Encounter for general adult medical examination without abnormal findings: Secondary | ICD-10-CM | POA: Diagnosis not present

## 2021-11-23 DIAGNOSIS — Z1331 Encounter for screening for depression: Secondary | ICD-10-CM | POA: Diagnosis not present

## 2021-11-23 DIAGNOSIS — M1990 Unspecified osteoarthritis, unspecified site: Secondary | ICD-10-CM | POA: Diagnosis not present

## 2021-11-23 DIAGNOSIS — E7800 Pure hypercholesterolemia, unspecified: Secondary | ICD-10-CM | POA: Diagnosis not present

## 2021-12-16 DIAGNOSIS — M1711 Unilateral primary osteoarthritis, right knee: Secondary | ICD-10-CM | POA: Diagnosis not present

## 2021-12-30 DIAGNOSIS — L570 Actinic keratosis: Secondary | ICD-10-CM | POA: Diagnosis not present

## 2021-12-30 DIAGNOSIS — L821 Other seborrheic keratosis: Secondary | ICD-10-CM | POA: Diagnosis not present

## 2021-12-30 DIAGNOSIS — X32XXXD Exposure to sunlight, subsequent encounter: Secondary | ICD-10-CM | POA: Diagnosis not present

## 2021-12-31 DIAGNOSIS — E039 Hypothyroidism, unspecified: Secondary | ICD-10-CM | POA: Diagnosis not present

## 2021-12-31 DIAGNOSIS — Z01818 Encounter for other preprocedural examination: Secondary | ICD-10-CM | POA: Diagnosis not present

## 2021-12-31 DIAGNOSIS — M25561 Pain in right knee: Secondary | ICD-10-CM | POA: Diagnosis not present

## 2022-01-12 DIAGNOSIS — M25561 Pain in right knee: Secondary | ICD-10-CM | POA: Diagnosis not present

## 2022-02-11 DIAGNOSIS — M1711 Unilateral primary osteoarthritis, right knee: Secondary | ICD-10-CM | POA: Diagnosis not present

## 2022-02-11 DIAGNOSIS — G8918 Other acute postprocedural pain: Secondary | ICD-10-CM | POA: Diagnosis not present

## 2022-02-15 DIAGNOSIS — M25561 Pain in right knee: Secondary | ICD-10-CM | POA: Diagnosis not present

## 2022-02-17 DIAGNOSIS — M25561 Pain in right knee: Secondary | ICD-10-CM | POA: Diagnosis not present

## 2022-02-19 DIAGNOSIS — M25561 Pain in right knee: Secondary | ICD-10-CM | POA: Diagnosis not present

## 2022-02-22 DIAGNOSIS — M25561 Pain in right knee: Secondary | ICD-10-CM | POA: Diagnosis not present

## 2022-02-24 DIAGNOSIS — M25561 Pain in right knee: Secondary | ICD-10-CM | POA: Diagnosis not present

## 2022-02-26 DIAGNOSIS — Z4789 Encounter for other orthopedic aftercare: Secondary | ICD-10-CM | POA: Diagnosis not present

## 2022-02-26 DIAGNOSIS — M25561 Pain in right knee: Secondary | ICD-10-CM | POA: Diagnosis not present

## 2022-03-01 DIAGNOSIS — M25561 Pain in right knee: Secondary | ICD-10-CM | POA: Diagnosis not present

## 2022-03-04 ENCOUNTER — Other Ambulatory Visit: Payer: Self-pay | Admitting: Family Medicine

## 2022-03-04 DIAGNOSIS — M25561 Pain in right knee: Secondary | ICD-10-CM | POA: Diagnosis not present

## 2022-03-04 DIAGNOSIS — Z1231 Encounter for screening mammogram for malignant neoplasm of breast: Secondary | ICD-10-CM

## 2022-03-08 DIAGNOSIS — M25561 Pain in right knee: Secondary | ICD-10-CM | POA: Diagnosis not present

## 2022-03-11 DIAGNOSIS — M25561 Pain in right knee: Secondary | ICD-10-CM | POA: Diagnosis not present

## 2022-03-15 DIAGNOSIS — M25561 Pain in right knee: Secondary | ICD-10-CM | POA: Diagnosis not present

## 2022-03-18 DIAGNOSIS — M25561 Pain in right knee: Secondary | ICD-10-CM | POA: Diagnosis not present

## 2022-03-22 DIAGNOSIS — M25561 Pain in right knee: Secondary | ICD-10-CM | POA: Diagnosis not present

## 2022-03-24 DIAGNOSIS — M25561 Pain in right knee: Secondary | ICD-10-CM | POA: Diagnosis not present

## 2022-04-08 ENCOUNTER — Ambulatory Visit
Admission: RE | Admit: 2022-04-08 | Discharge: 2022-04-08 | Disposition: A | Discharge status: Home / Self Care | Payer: Medicare HMO | Source: Ambulatory Visit | Attending: Family Medicine | Admitting: Family Medicine

## 2022-04-08 DIAGNOSIS — Z1231 Encounter for screening mammogram for malignant neoplasm of breast: Secondary | ICD-10-CM | POA: Diagnosis not present

## 2022-05-07 DIAGNOSIS — U071 COVID-19: Secondary | ICD-10-CM | POA: Diagnosis not present

## 2022-05-20 DIAGNOSIS — Z4789 Encounter for other orthopedic aftercare: Secondary | ICD-10-CM | POA: Diagnosis not present

## 2022-05-27 DIAGNOSIS — G250 Essential tremor: Secondary | ICD-10-CM | POA: Diagnosis not present

## 2022-05-27 DIAGNOSIS — E7800 Pure hypercholesterolemia, unspecified: Secondary | ICD-10-CM | POA: Diagnosis not present

## 2022-05-27 DIAGNOSIS — E039 Hypothyroidism, unspecified: Secondary | ICD-10-CM | POA: Diagnosis not present

## 2022-05-27 DIAGNOSIS — K219 Gastro-esophageal reflux disease without esophagitis: Secondary | ICD-10-CM | POA: Diagnosis not present

## 2022-05-27 DIAGNOSIS — I10 Essential (primary) hypertension: Secondary | ICD-10-CM | POA: Diagnosis not present

## 2022-05-27 DIAGNOSIS — M1710 Unilateral primary osteoarthritis, unspecified knee: Secondary | ICD-10-CM | POA: Diagnosis not present

## 2022-06-30 DIAGNOSIS — X32XXXD Exposure to sunlight, subsequent encounter: Secondary | ICD-10-CM | POA: Diagnosis not present

## 2022-06-30 DIAGNOSIS — L570 Actinic keratosis: Secondary | ICD-10-CM | POA: Diagnosis not present

## 2022-10-04 ENCOUNTER — Encounter: Payer: Self-pay | Admitting: Gastroenterology

## 2022-10-11 ENCOUNTER — Encounter: Payer: Self-pay | Admitting: Gastroenterology

## 2022-10-19 DIAGNOSIS — Z4789 Encounter for other orthopedic aftercare: Secondary | ICD-10-CM | POA: Diagnosis not present

## 2022-10-19 DIAGNOSIS — M25552 Pain in left hip: Secondary | ICD-10-CM | POA: Diagnosis not present

## 2022-11-09 ENCOUNTER — Encounter: Payer: Self-pay | Admitting: Gastroenterology

## 2022-11-09 ENCOUNTER — Ambulatory Visit (AMBULATORY_SURGERY_CENTER): Payer: Medicare HMO | Admitting: *Deleted

## 2022-11-09 VITALS — Ht 64.0 in | Wt 200.0 lb

## 2022-11-09 DIAGNOSIS — Z86010 Personal history of colonic polyps: Secondary | ICD-10-CM

## 2022-11-09 MED ORDER — PEG 3350-KCL-NA BICARB-NACL 420 G PO SOLR
4000.0000 mL | Freq: Once | ORAL | 0 refills | Status: AC
Start: 2022-11-09 — End: 2022-11-09

## 2022-11-09 NOTE — Progress Notes (Signed)
Pt's name and DOB verified at the beginning of the pre-visit.  Pt denies any difficulty with ambulating,sitting, laying down or rolling side to side Gave both [facility] main # and MD on call # prior to instructions.  No egg or soy allergy known to patient  No issues known to pt with past sedation with any surgeries or procedures Pt denies having issues being intubated Pt has no issues moving head neck or swallowing No FH of Malignant Hyperthermia Pt is not on diet pills Pt is not on home 02  Pt is not on blood thinners  Pt has issues with constipation and takes Ducosate daily Pt is not on dialysis Pt denise any abnormal heart rhythms  Pt denies any upcoming cardiac testing Pt encouraged to use to use Singlecare or Goodrx to reduce cost  Patient's chart reviewed by Cathlyn Parsons CNRA prior to pre-visit and patient appropriate for the [facility].  Pre-visit completed and red dot placed by patient's name on their procedure day (on provider's schedule).  . Visit in person Pt scale weight is 200 lb Instructed pt why it is important to and  to call if they have any changes in health or new medications. Directed them to the # given and on instructions.   Pt states they will.  Instructions reviewed with pt and pt states understanding. Instructed to review again prior to procedure. Pt states they will.  Instructions given to pt my chart and sent to my chart Pt has visible tremors in hands but no issues walking

## 2022-11-26 DIAGNOSIS — M1612 Unilateral primary osteoarthritis, left hip: Secondary | ICD-10-CM | POA: Diagnosis not present

## 2022-11-26 DIAGNOSIS — M25551 Pain in right hip: Secondary | ICD-10-CM | POA: Diagnosis not present

## 2022-11-30 ENCOUNTER — Ambulatory Visit (AMBULATORY_SURGERY_CENTER): Payer: Medicare HMO | Admitting: Gastroenterology

## 2022-11-30 ENCOUNTER — Encounter: Payer: Self-pay | Admitting: Gastroenterology

## 2022-11-30 VITALS — BP 123/69 | HR 73 | Temp 97.3°F | Resp 17 | Ht 64.0 in | Wt 200.0 lb

## 2022-11-30 DIAGNOSIS — D128 Benign neoplasm of rectum: Secondary | ICD-10-CM

## 2022-11-30 DIAGNOSIS — E785 Hyperlipidemia, unspecified: Secondary | ICD-10-CM | POA: Diagnosis not present

## 2022-11-30 DIAGNOSIS — D123 Benign neoplasm of transverse colon: Secondary | ICD-10-CM | POA: Diagnosis not present

## 2022-11-30 DIAGNOSIS — K635 Polyp of colon: Secondary | ICD-10-CM | POA: Diagnosis not present

## 2022-11-30 DIAGNOSIS — I10 Essential (primary) hypertension: Secondary | ICD-10-CM | POA: Diagnosis not present

## 2022-11-30 DIAGNOSIS — Z86010 Personal history of colonic polyps: Secondary | ICD-10-CM | POA: Diagnosis not present

## 2022-11-30 DIAGNOSIS — G2581 Restless legs syndrome: Secondary | ICD-10-CM | POA: Diagnosis not present

## 2022-11-30 DIAGNOSIS — Z09 Encounter for follow-up examination after completed treatment for conditions other than malignant neoplasm: Secondary | ICD-10-CM | POA: Diagnosis not present

## 2022-11-30 MED ORDER — SODIUM CHLORIDE 0.9 % IV SOLN: 500.0000 mL | Freq: Once | INTRAVENOUS | Status: AC

## 2022-11-30 NOTE — Op Note (Signed)
[facility] Patient Name: Brandi Richards Procedure Date: 11/30/2022 7:58 AM MRN: [id number] Endoscopist: Sherilyn Cooter L. Myrtie Neither , MD, [id number] Age: 77 Referring MD:  Date of Birth: 1945-08-21 Gender: Female Account #: [id number] Procedure:                Colonoscopy Indications:              Surveillance: Personal history of adenomatous                            polyps on last colonoscopy 3 years ago                           7 tubular adenomas, including rectal polyp with                            focal HGD in June 2017, suboptimal prep                           No polyps June 2018                           5 subcentimeter tubular adenomas July 2021 Medicines:                Monitored Anesthesia Care Procedure:                Pre-Anesthesia Assessment:                           - Prior to the procedure, a History and Physical                            was performed, and patient medications and                            allergies were reviewed. The patient's tolerance of                            previous anesthesia was also reviewed. The risks                            and benefits of the procedure and the sedation                            options and risks were discussed with the patient.                            All questions were answered, and informed consent                            was obtained. Prior Anticoagulants: The patient has                            taken no anticoagulant or antiplatelet agents. ASA  Grade Assessment: II - A patient with mild systemic                            disease. After reviewing the risks and benefits,                            the patient was deemed in satisfactory condition to                            undergo the procedure.                           After obtaining informed consent, the colonoscope                            was passed under direct vision. Throughout the                             procedure, the patient's blood pressure, pulse, and                            oxygen saturations were monitored continuously. The                            CF [id number] #[id number] was introduced through the anus                            and advanced to the the cecum, identified by                            appendiceal orifice and ileocecal valve. The                            colonoscopy was performed with difficulty due to a                            redundant colon and significant looping. Successful                            completion of the procedure was aided by using                            manual pressure and straightening and shortening                            the scope to obtain bowel loop reduction. The                            patient tolerated the procedure well. The quality                            of the bowel preparation was good. The ileocecal  valve, appendiceal orifice, and rectum were                            photographed. The bowel preparation used was                            GoLYTELY via split dose instruction. Scope In: 8:09:48 AM Scope Out: 8:36:06 AM Scope Withdrawal Time: 0 hours 17 minutes 4 seconds  Total Procedure Duration: 0 hours 26 minutes 18 seconds  Findings:                 The perianal and digital rectal examinations were                            normal.                           A diminutive polyp was found in the transverse                            colon. The polyp was sessile. The polyp was removed                            with a cold snare. Resection and retrieval were                            complete.                           Repeat examination of right colon under NBI                            performed.                           The sigmoid colon was redundant.                           A 10 mm polyp was found in the distal rectum (on                            the most distal rectal fold).  The polyp was                            semi-sessile. The polyp was removed with a hot                            snare. Resection and retrieval were complete.                           Internal hemorrhoids were found.                           The exam was otherwise without abnormality on  direct and retroflexion views. Complications:            No immediate complications. Estimated Blood Loss:     Estimated blood loss was minimal. Impression:               - One diminutive polyp in the transverse colon,                            removed with a cold snare. Resected and retrieved.                           - Redundant colon.                           - One 10 mm polyp in the distal rectum, removed                            with a hot snare. Resected and retrieved.                           - Internal hemorrhoids.                           - The examination was otherwise normal on direct                            and retroflexion views. Recommendation:           - Patient has a contact number available for                            emergencies. The signs and symptoms of potential                            delayed complications were discussed with the                            patient. Return to normal activities tomorrow.                            Written discharge instructions were provided to the                            patient.                           - Resume previous diet.                           - Continue present medications.                           - Await pathology results.                           - Repeat colonoscopy is recommended for  surveillance. The colonoscopy date will be                            determined after pathology results from today's                            exam become available for review. Taren Toops L. Myrtie Neither, MD 11/30/2022 8:42:32 AM This report has been signed electronically.

## 2022-11-30 NOTE — Progress Notes (Signed)
Called to room to assist during endoscopic procedure.  Patient ID and intended procedure confirmed with present staff. Received instructions for my participation in the procedure from the performing physician.  

## 2022-11-30 NOTE — Patient Instructions (Signed)
YOU HAD AN ENDOSCOPIC PROCEDURE TODAY AT THE [facility]:   Refer to the procedure report that was given to you for any specific questions about what was found during the examination.  If the procedure report does not answer your questions, please call your gastroenterologist to clarify.  If you requested that your care partner not be given the details of your procedure findings, then the procedure report has been included in a sealed envelope for you to review at your convenience later.  YOU SHOULD EXPECT: Some feelings of bloating in the abdomen. Passage of more gas than usual.  Walking can help get rid of the air that was put into your GI tract during the procedure and reduce the bloating. If you had a lower endoscopy (such as a colonoscopy or flexible sigmoidoscopy) you may notice spotting of blood in your stool or on the toilet paper. If you underwent a bowel prep for your procedure, you may not have a normal bowel movement for a few days.  Please Note:  You might notice some irritation and congestion in your nose or some drainage.  This is from the oxygen used during your procedure.  There is no need for concern and it should clear up in a day or so.  SYMPTOMS TO REPORT IMMEDIATELY:  Following lower endoscopy (colonoscopy or flexible sigmoidoscopy):  Excessive amounts of blood in the stool  Significant tenderness or worsening of abdominal pains  Swelling of the abdomen that is new, acute  Fever of 100F or higher   For urgent or emergent issues, a gastroenterologist can be reached at any hour by calling [phone]. Do not use MyChart messaging for urgent concerns.    DIET:  We do recommend a small meal at first, but then you may proceed to your regular diet.  Drink plenty of fluids but you should avoid alcoholic beverages for 24 hours.  MEDICATIONS: Continue present medications.  Please see handouts given to you by your recovery nurse: Polyps, Hemorrhoids.  FOLLOW UP:  Await pathology results. Repeat colonoscopy is recommended for surveillance. The colonoscopy date will be determined after pathology results from today's exam become available for review.  Thank you for allowing [place] to provide for your healthcare needs today.  ACTIVITY:  You should plan to take it easy for the rest of today and you should NOT DRIVE or use heavy machinery until tomorrow (because of the sedation medicines used during the test).    FOLLOW UP: Our staff will call the number listed on your records the next business day following your procedure.  We will call around 7:15- 8:00 am to check on you and address any questions or concerns that you may have regarding the information given to you following your procedure. If we do not reach you, we will leave a message.     If any biopsies were taken you will be contacted by phone or by letter within the next 1-3 weeks.  Please call us at [phone] if you have not heard about the biopsies in 3 weeks.    SIGNATURES/CONFIDENTIALITY: You and/or your care partner have signed paperwork which will be entered into your electronic medical record.  These signatures attest to the fact that that the information above on your After Visit Summary has been reviewed and is understood.  Full responsibility of the confidentiality of this discharge information lies with you and/or your care-partner.

## 2022-11-30 NOTE — Progress Notes (Signed)
Pt's states no medical or surgical changes since previsit or office visit. 

## 2022-11-30 NOTE — Progress Notes (Signed)
A and O x3. Report to RN. Tolerated MAC anesthesia well. 

## 2022-11-30 NOTE — Progress Notes (Signed)
History and Physical:  This patient presents for endoscopic testing for: Encounter Diagnosis  Name Primary?   Personal history of colonic polyps Yes    Multiple adenomatous polyps, most recently July 2021 Patient denies chronic abdominal pain, rectal bleeding, constipation or diarrhea.   Patient is otherwise without complaints or active issues today.   Past Medical History: Past Medical History:  Diagnosis Date   Arthritis    Cataract    bilat removed   Essential and other specified forms of tremor    Hyperlipidemia    Hypertension    Movement disorder    Nocturnal leg cramps    Osteopenia    Restless leg syndrome    Thyroid disease      Past Surgical History: Past Surgical History:  Procedure Laterality Date   BACK SURGERY     CATARACT EXTRACTION     Bil   CHOLECYSTECTOMY     COLONOSCOPY     2018   NASAL SINUS SURGERY     PARTIAL HYSTERECTOMY     PLANTAR FASCIECTOMY Left    REPLACEMENT TOTAL KNEE Right    SHOULDER OPEN ROTATOR CUFF REPAIR Right 11/30/2017   Procedure: Right shoulder open acromionectomy and acromioplasty with repair of rotator cuff and DermaSpan graft;  Surgeon: Ranee Gosselin, MD;  Location: [facility];  Service: Orthopedics;  Laterality: Right;   SHOULDER SURGERY Left    TONSILLECTOMY     TUBAL LIGATION      Allergies: Allergies  Allergen Reactions   Morphine And Codeine Nausea And Vomiting    After shoulder surgery   Primidone Nausea And Vomiting   Sulfonamide Derivatives Hives and Rash   Oxycodone Nausea Only   Tylenol [Acetaminophen] Other (See Comments)    Increases liver enzymes    Outpatient Meds: Current Outpatient Medications  Medication Sig Dispense Refill   gabapentin (NEURONTIN) 300 MG capsule 1 or 2 capsules at night     hydrochlorothiazide (HYDRODIURIL) 25 MG tablet Take 25 mg by mouth daily.      levothyroxine (SYNTHROID, LEVOTHROID) 112 MCG tablet Take 112 mcg by mouth daily before breakfast.     lisinopril  (PRINIVIL,ZESTRIL) 20 MG tablet Take 20 mg by mouth at bedtime.     omeprazole (PRILOSEC) 20 MG capsule Take 20 mg by mouth daily before breakfast.  1   pravastatin (PRAVACHOL) 40 MG tablet Take 40 mg by mouth at bedtime.      senna-docusate (SENOKOT-S) 8.6-50 MG tablet Take 1 tablet by mouth daily.     ALPRAZolam (XANAX) 0.25 MG tablet Take 1 tablet (0.25 mg total) by mouth 3 (three) times daily as needed. (Patient not taking: Reported on 11/09/2022) 270 tablet 1   amoxicillin-clavulanate (AUGMENTIN) 875-125 MG tablet Take 1 tablet by mouth every 12 (twelve) hours. (Patient not taking: Reported on 11/09/2022) 14 tablet 0   aspirin 81 MG tablet Take 81 mg by mouth at bedtime.  (Patient not taking: Reported on 11/09/2022)     benzonatate (TESSALON) 100 MG capsule Take 1 capsule (100 mg total) by mouth every 8 (eight) hours as needed for cough. (Patient not taking: Reported on 11/09/2022) 21 capsule 0   celecoxib (CELEBREX) 200 MG capsule Take 1 capsule by mouth daily.     Cholecalciferol (VITAMIN D3) 1000 units CAPS Take 1,000 Units by mouth at bedtime.  (Patient not taking: Reported on 11/09/2022)     Multiple Vitamin (MULTIVITAMIN WITH MINERALS) TABS tablet Take 1 tablet by mouth daily.     Omega-3 Fatty Acids (FISH  OIL) 1000 MG CAPS Take 1,000 mg by mouth at bedtime.      promethazine (PHENERGAN) 25 MG tablet TAKE 1 TABLET BY MOUTH EVERY 4 HOURS AS NEEDED FOR NAUSEA     promethazine-dextromethorphan (PROMETHAZINE-DM) 6.25-15 MG/5ML syrup Take 5 mLs by mouth 4 (four) times daily as needed for cough. (Patient not taking: Reported on 11/09/2022) 118 mL 0   tretinoin (RETIN-A) 0.025 % cream APPLY CREAM EXTERNALLY TO AFFECTED AREA ONCE DAILY AT BEDTIME     vitamin B-12 (CYANOCOBALAMIN) 1000 MCG tablet Take 1,000 mcg by mouth daily. (Patient not taking: Reported on 12/20/2019)     Current Facility-Administered Medications  Medication Dose Route Frequency Provider Last Rate Last Admin   0.9 %  sodium chloride  infusion  500 mL Intravenous Continuous Danis, Starr Lake III, MD       0.9 %  sodium chloride infusion  500 mL Intravenous Once Charlie Pitter III, MD          ___________________________________________________________________ Objective   Exam:  BP 136/80   Pulse 78   Temp (!) 97.3 F (36.3 C)   Resp 20   Ht 5\' 4"  (1.626 m)   Wt 200 lb (90.7 kg)   SpO2 99%   BMI 34.33 kg/m   CV: regular , S1/S2 Resp: clear to auscultation bilaterally, normal RR and effort noted GI: soft, no tenderness, with active bowel sounds.   Assessment: Encounter Diagnosis  Name Primary?   Personal history of colonic polyps Yes     Plan: Colonoscopy   The benefits and risks of the planned procedure were described in detail with the patient or (when appropriate) their health care proxy.  Risks were outlined as including, but not limited to, bleeding, infection, perforation, adverse medication reaction leading to cardiac or pulmonary decompensation, pancreatitis (if ERCP).  The limitation of incomplete mucosal visualization was also discussed.  No guarantees or warranties were given.  The patient is appropriate for an endoscopic procedure in the ambulatory setting.   - Brandi Jupiter, MD

## 2022-12-01 ENCOUNTER — Telehealth: Payer: Self-pay

## 2022-12-01 DIAGNOSIS — M25552 Pain in left hip: Secondary | ICD-10-CM | POA: Diagnosis not present

## 2022-12-01 NOTE — Telephone Encounter (Signed)
Attempted f/u call. No answer, left VM. 

## 2022-12-03 ENCOUNTER — Encounter: Payer: Self-pay | Admitting: Gastroenterology

## 2022-12-06 DIAGNOSIS — R252 Cramp and spasm: Secondary | ICD-10-CM | POA: Diagnosis not present

## 2022-12-06 DIAGNOSIS — K219 Gastro-esophageal reflux disease without esophagitis: Secondary | ICD-10-CM | POA: Diagnosis not present

## 2022-12-06 DIAGNOSIS — Z0000 Encounter for general adult medical examination without abnormal findings: Secondary | ICD-10-CM | POA: Diagnosis not present

## 2022-12-06 DIAGNOSIS — J45909 Unspecified asthma, uncomplicated: Secondary | ICD-10-CM | POA: Diagnosis not present

## 2022-12-06 DIAGNOSIS — E7800 Pure hypercholesterolemia, unspecified: Secondary | ICD-10-CM | POA: Diagnosis not present

## 2022-12-06 DIAGNOSIS — G4700 Insomnia, unspecified: Secondary | ICD-10-CM | POA: Diagnosis not present

## 2022-12-06 DIAGNOSIS — E559 Vitamin D deficiency, unspecified: Secondary | ICD-10-CM | POA: Diagnosis not present

## 2022-12-06 DIAGNOSIS — Z1331 Encounter for screening for depression: Secondary | ICD-10-CM | POA: Diagnosis not present

## 2022-12-06 DIAGNOSIS — E039 Hypothyroidism, unspecified: Secondary | ICD-10-CM | POA: Diagnosis not present

## 2022-12-06 DIAGNOSIS — I10 Essential (primary) hypertension: Secondary | ICD-10-CM | POA: Diagnosis not present

## 2023-01-07 DIAGNOSIS — M1612 Unilateral primary osteoarthritis, left hip: Secondary | ICD-10-CM | POA: Diagnosis not present

## 2023-01-07 DIAGNOSIS — M25551 Pain in right hip: Secondary | ICD-10-CM | POA: Diagnosis not present

## 2023-01-07 DIAGNOSIS — M5136 Other intervertebral disc degeneration, lumbar region: Secondary | ICD-10-CM | POA: Diagnosis not present

## 2023-02-07 DIAGNOSIS — E039 Hypothyroidism, unspecified: Secondary | ICD-10-CM | POA: Diagnosis not present

## 2023-02-15 DIAGNOSIS — Z961 Presence of intraocular lens: Secondary | ICD-10-CM | POA: Diagnosis not present

## 2023-02-15 DIAGNOSIS — H0102B Squamous blepharitis left eye, upper and lower eyelids: Secondary | ICD-10-CM | POA: Diagnosis not present

## 2023-02-15 DIAGNOSIS — H43813 Vitreous degeneration, bilateral: Secondary | ICD-10-CM | POA: Diagnosis not present

## 2023-02-15 DIAGNOSIS — H0102A Squamous blepharitis right eye, upper and lower eyelids: Secondary | ICD-10-CM | POA: Diagnosis not present

## 2023-02-15 DIAGNOSIS — H04123 Dry eye syndrome of bilateral lacrimal glands: Secondary | ICD-10-CM | POA: Diagnosis not present

## 2023-03-01 ENCOUNTER — Other Ambulatory Visit: Payer: Self-pay | Admitting: Family Medicine

## 2023-03-01 DIAGNOSIS — Z0000 Encounter for general adult medical examination without abnormal findings: Secondary | ICD-10-CM

## 2023-03-02 ENCOUNTER — Other Ambulatory Visit: Payer: Self-pay | Admitting: Family Medicine

## 2023-03-02 DIAGNOSIS — E2839 Other primary ovarian failure: Secondary | ICD-10-CM

## 2023-03-04 ENCOUNTER — Ambulatory Visit
Admission: RE | Admit: 2023-03-04 | Discharge: 2023-03-04 | Disposition: A | Discharge status: Home / Self Care | Payer: Medicare HMO | Source: Ambulatory Visit | Attending: Family Medicine | Admitting: Family Medicine

## 2023-03-04 DIAGNOSIS — E349 Endocrine disorder, unspecified: Secondary | ICD-10-CM | POA: Diagnosis not present

## 2023-03-04 DIAGNOSIS — N958 Other specified menopausal and perimenopausal disorders: Secondary | ICD-10-CM | POA: Diagnosis not present

## 2023-03-04 DIAGNOSIS — M8588 Other specified disorders of bone density and structure, other site: Secondary | ICD-10-CM | POA: Diagnosis not present

## 2023-03-04 DIAGNOSIS — E2839 Other primary ovarian failure: Secondary | ICD-10-CM

## 2023-03-14 DIAGNOSIS — M5136 Other intervertebral disc degeneration, lumbar region with discogenic back pain only: Secondary | ICD-10-CM | POA: Diagnosis not present

## 2023-03-14 DIAGNOSIS — M1612 Unilateral primary osteoarthritis, left hip: Secondary | ICD-10-CM | POA: Diagnosis not present

## 2023-03-22 DIAGNOSIS — L570 Actinic keratosis: Secondary | ICD-10-CM | POA: Diagnosis not present

## 2023-03-22 DIAGNOSIS — X32XXXD Exposure to sunlight, subsequent encounter: Secondary | ICD-10-CM | POA: Diagnosis not present

## 2023-04-11 ENCOUNTER — Ambulatory Visit
Admission: RE | Admit: 2023-04-11 | Discharge: 2023-04-11 | Disposition: A | Discharge status: Home / Self Care | Payer: Medicare HMO | Source: Ambulatory Visit | Attending: Family Medicine | Admitting: Family Medicine

## 2023-04-11 DIAGNOSIS — Z0000 Encounter for general adult medical examination without abnormal findings: Secondary | ICD-10-CM

## 2023-04-11 DIAGNOSIS — Z1231 Encounter for screening mammogram for malignant neoplasm of breast: Secondary | ICD-10-CM | POA: Diagnosis not present

## 2023-05-12 DIAGNOSIS — E039 Hypothyroidism, unspecified: Secondary | ICD-10-CM | POA: Diagnosis not present

## 2023-06-14 DIAGNOSIS — K219 Gastro-esophageal reflux disease without esophagitis: Secondary | ICD-10-CM | POA: Diagnosis not present

## 2023-06-14 DIAGNOSIS — E7800 Pure hypercholesterolemia, unspecified: Secondary | ICD-10-CM | POA: Diagnosis not present

## 2023-06-14 DIAGNOSIS — G250 Essential tremor: Secondary | ICD-10-CM | POA: Diagnosis not present

## 2023-06-14 DIAGNOSIS — M1990 Unspecified osteoarthritis, unspecified site: Secondary | ICD-10-CM | POA: Diagnosis not present

## 2023-06-14 DIAGNOSIS — E039 Hypothyroidism, unspecified: Secondary | ICD-10-CM | POA: Diagnosis not present

## 2023-06-14 DIAGNOSIS — I10 Essential (primary) hypertension: Secondary | ICD-10-CM | POA: Diagnosis not present

## 2023-06-14 DIAGNOSIS — G4700 Insomnia, unspecified: Secondary | ICD-10-CM | POA: Diagnosis not present

## 2023-07-14 DIAGNOSIS — E860 Dehydration: Secondary | ICD-10-CM | POA: Diagnosis not present

## 2023-08-03 DIAGNOSIS — M4180 Other forms of scoliosis, site unspecified: Secondary | ICD-10-CM | POA: Diagnosis not present

## 2023-08-03 DIAGNOSIS — T402X5A Adverse effect of other opioids, initial encounter: Secondary | ICD-10-CM | POA: Diagnosis not present

## 2023-08-03 DIAGNOSIS — M5136 Other intervertebral disc degeneration, lumbar region with discogenic back pain only: Secondary | ICD-10-CM | POA: Diagnosis not present

## 2023-08-03 DIAGNOSIS — M545 Low back pain, unspecified: Secondary | ICD-10-CM | POA: Diagnosis not present

## 2023-09-05 DIAGNOSIS — T402X5A Adverse effect of other opioids, initial encounter: Secondary | ICD-10-CM | POA: Diagnosis not present

## 2023-09-05 DIAGNOSIS — M4180 Other forms of scoliosis, site unspecified: Secondary | ICD-10-CM | POA: Diagnosis not present

## 2023-09-05 DIAGNOSIS — M545 Low back pain, unspecified: Secondary | ICD-10-CM | POA: Diagnosis not present

## 2023-09-05 DIAGNOSIS — M5136 Other intervertebral disc degeneration, lumbar region with discogenic back pain only: Secondary | ICD-10-CM | POA: Diagnosis not present

## 2023-11-08 DIAGNOSIS — M5416 Radiculopathy, lumbar region: Secondary | ICD-10-CM | POA: Diagnosis not present

## 2023-11-16 DIAGNOSIS — R058 Other specified cough: Secondary | ICD-10-CM | POA: Diagnosis not present

## 2023-11-16 DIAGNOSIS — J019 Acute sinusitis, unspecified: Secondary | ICD-10-CM | POA: Diagnosis not present

## 2023-12-06 DIAGNOSIS — M5136 Other intervertebral disc degeneration, lumbar region with discogenic back pain only: Secondary | ICD-10-CM | POA: Diagnosis not present

## 2023-12-27 DIAGNOSIS — L570 Actinic keratosis: Secondary | ICD-10-CM | POA: Diagnosis not present

## 2023-12-27 DIAGNOSIS — X32XXXD Exposure to sunlight, subsequent encounter: Secondary | ICD-10-CM | POA: Diagnosis not present

## 2023-12-29 DIAGNOSIS — Z23 Encounter for immunization: Secondary | ICD-10-CM | POA: Diagnosis not present

## 2023-12-29 DIAGNOSIS — K219 Gastro-esophageal reflux disease without esophagitis: Secondary | ICD-10-CM | POA: Diagnosis not present

## 2023-12-29 DIAGNOSIS — E7800 Pure hypercholesterolemia, unspecified: Secondary | ICD-10-CM | POA: Diagnosis not present

## 2023-12-29 DIAGNOSIS — E039 Hypothyroidism, unspecified: Secondary | ICD-10-CM | POA: Diagnosis not present

## 2023-12-29 DIAGNOSIS — G250 Essential tremor: Secondary | ICD-10-CM | POA: Diagnosis not present

## 2023-12-29 DIAGNOSIS — Z1331 Encounter for screening for depression: Secondary | ICD-10-CM | POA: Diagnosis not present

## 2023-12-29 DIAGNOSIS — I10 Essential (primary) hypertension: Secondary | ICD-10-CM | POA: Diagnosis not present

## 2023-12-29 DIAGNOSIS — R748 Abnormal levels of other serum enzymes: Secondary | ICD-10-CM | POA: Diagnosis not present

## 2023-12-29 DIAGNOSIS — Z0000 Encounter for general adult medical examination without abnormal findings: Secondary | ICD-10-CM | POA: Diagnosis not present

## 2024-02-21 DIAGNOSIS — M5416 Radiculopathy, lumbar region: Secondary | ICD-10-CM | POA: Diagnosis not present

## 2024-03-09 ENCOUNTER — Other Ambulatory Visit: Payer: Self-pay | Admitting: Family Medicine

## 2024-03-09 DIAGNOSIS — Z1231 Encounter for screening mammogram for malignant neoplasm of breast: Secondary | ICD-10-CM

## 2024-04-10 DIAGNOSIS — Z5181 Encounter for therapeutic drug level monitoring: Secondary | ICD-10-CM | POA: Diagnosis not present

## 2024-04-10 DIAGNOSIS — M5136 Other intervertebral disc degeneration, lumbar region with discogenic back pain only: Secondary | ICD-10-CM | POA: Diagnosis not present

## 2024-04-10 DIAGNOSIS — M1711 Unilateral primary osteoarthritis, right knee: Secondary | ICD-10-CM | POA: Diagnosis not present

## 2024-04-10 DIAGNOSIS — T402X5A Adverse effect of other opioids, initial encounter: Secondary | ICD-10-CM | POA: Diagnosis not present

## 2024-04-10 DIAGNOSIS — M5416 Radiculopathy, lumbar region: Secondary | ICD-10-CM | POA: Diagnosis not present

## 2024-04-11 ENCOUNTER — Ambulatory Visit
Admission: RE | Admit: 2024-04-11 | Discharge: 2024-04-11 | Disposition: A | Discharge status: Home / Self Care | Source: Ambulatory Visit | Attending: Family Medicine | Admitting: Family Medicine

## 2024-04-11 DIAGNOSIS — Z1231 Encounter for screening mammogram for malignant neoplasm of breast: Secondary | ICD-10-CM | POA: Diagnosis not present
